# Patient Record
Sex: Female | Born: 1935 | Race: Black or African American | Hispanic: No | Marital: Single | Smoking: Never smoker
Health system: Southern US, Community
[De-identification: ages and names within clinical notes are randomized; demographics above are authoritative.]

## PROBLEM LIST (undated history)

## (undated) DIAGNOSIS — E785 Hyperlipidemia, unspecified: Secondary | ICD-10-CM

## (undated) DIAGNOSIS — I10 Essential (primary) hypertension: Secondary | ICD-10-CM

## (undated) DIAGNOSIS — M1990 Unspecified osteoarthritis, unspecified site: Secondary | ICD-10-CM

## (undated) HISTORY — PX: PACEMAKER INSERTION: SHX728

---

## 2011-05-17 ENCOUNTER — Emergency Department (HOSPITAL_COMMUNITY): Payer: Medicare PPO

## 2011-05-17 ENCOUNTER — Inpatient Hospital Stay (HOSPITAL_COMMUNITY)
Admission: EM | Admit: 2011-05-17 | Discharge: 2011-05-21 | DRG: 300 | Disposition: A | Discharge status: Home Health | Payer: Medicare PPO | Attending: Internal Medicine | Admitting: Internal Medicine

## 2011-05-17 DIAGNOSIS — R062 Wheezing: Secondary | ICD-10-CM | POA: Diagnosis present

## 2011-05-17 DIAGNOSIS — I82409 Acute embolism and thrombosis of unspecified deep veins of unspecified lower extremity: Principal | ICD-10-CM | POA: Diagnosis present

## 2011-05-17 DIAGNOSIS — N039 Chronic nephritic syndrome with unspecified morphologic changes: Secondary | ICD-10-CM | POA: Diagnosis present

## 2011-05-17 DIAGNOSIS — Z8673 Personal history of transient ischemic attack (TIA), and cerebral infarction without residual deficits: Secondary | ICD-10-CM

## 2011-05-17 DIAGNOSIS — I129 Hypertensive chronic kidney disease with stage 1 through stage 4 chronic kidney disease, or unspecified chronic kidney disease: Secondary | ICD-10-CM | POA: Diagnosis present

## 2011-05-17 DIAGNOSIS — F0390 Unspecified dementia without behavioral disturbance: Secondary | ICD-10-CM | POA: Diagnosis present

## 2011-05-17 DIAGNOSIS — E785 Hyperlipidemia, unspecified: Secondary | ICD-10-CM | POA: Diagnosis present

## 2011-05-17 DIAGNOSIS — D631 Anemia in chronic kidney disease: Secondary | ICD-10-CM | POA: Diagnosis present

## 2011-05-17 DIAGNOSIS — N184 Chronic kidney disease, stage 4 (severe): Secondary | ICD-10-CM | POA: Diagnosis present

## 2011-05-17 DIAGNOSIS — R0602 Shortness of breath: Secondary | ICD-10-CM | POA: Diagnosis present

## 2011-05-17 DIAGNOSIS — I441 Atrioventricular block, second degree: Secondary | ICD-10-CM | POA: Diagnosis present

## 2011-05-17 LAB — CBC
Hemoglobin: 11.8 g/dL — ABNORMAL LOW (ref 12.0–15.0)
MCH: 29.1 pg (ref 26.0–34.0)
MCV: 90.6 fL (ref 78.0–100.0)
RBC: 4.06 MIL/uL (ref 3.87–5.11)
WBC: 7 10*3/uL (ref 4.0–10.5)

## 2011-05-17 LAB — URINALYSIS, ROUTINE W REFLEX MICROSCOPIC
Glucose, UA: NEGATIVE mg/dL
Ketones, ur: NEGATIVE mg/dL
Leukocytes, UA: NEGATIVE
Specific Gravity, Urine: 1.017 (ref 1.005–1.030)
pH: 5.5 (ref 5.0–8.0)

## 2011-05-17 LAB — TROPONIN I: Troponin I: <0.3 ng/mL (ref <0.30)

## 2011-05-17 LAB — BASIC METABOLIC PANEL
CO2: 24 mEq/L (ref 19–32)
Chloride: 106 mEq/L (ref 96–112)
Creatinine, Ser: 1.34 mg/dL — ABNORMAL HIGH (ref 0.50–1.10)
Potassium: 3.9 mEq/L (ref 3.5–5.1)

## 2011-05-17 LAB — CARDIAC PANEL(CRET KIN+CKTOT+MB+TROPI)
Relative Index: 2 (ref 0.0–2.5)
Troponin I: <0.3 ng/mL (ref <0.30)

## 2011-05-17 LAB — CK TOTAL AND CKMB (NOT AT ARMC)
CK, MB: 5.4 ng/mL — ABNORMAL HIGH (ref 0.3–4.0)
Relative Index: 2.2 (ref 0.0–2.5)
Total CK: 247 U/L — ABNORMAL HIGH (ref 7–177)

## 2011-05-17 LAB — DIFFERENTIAL
Basophils Relative: 0 % (ref 0–1)
Lymphs Abs: 1.5 10*3/uL (ref 0.7–4.0)
Monocytes Relative: 5 % (ref 3–12)
Neutro Abs: 5.1 10*3/uL (ref 1.7–7.7)
Neutrophils Relative %: 73 % (ref 43–77)

## 2011-05-17 LAB — COMPREHENSIVE METABOLIC PANEL
ALT: 12 U/L (ref 0–35)
AST: 21 U/L (ref 0–37)
Albumin: 3.3 g/dL — ABNORMAL LOW (ref 3.5–5.2)
CO2: 22 mEq/L (ref 19–32)
Chloride: 107 mEq/L (ref 96–112)
GFR calc non Af Amer: 38 mL/min — ABNORMAL LOW (ref >60)
Potassium: 3.6 mEq/L (ref 3.5–5.1)
Sodium: 136 mEq/L (ref 135–145)
Total Bilirubin: 0.2 mg/dL — ABNORMAL LOW (ref 0.3–1.2)

## 2011-05-17 LAB — APTT: aPTT: 82 seconds — ABNORMAL HIGH (ref 24–37)

## 2011-05-17 LAB — PRO B NATRIURETIC PEPTIDE: Pro B Natriuretic peptide (BNP): 209.7 pg/mL (ref 0–450)

## 2011-05-17 MED ORDER — XENON XE 133 GAS
10.0000 | GAS_FOR_INHALATION | Freq: Once | RESPIRATORY_TRACT | Status: AC | PRN
  Administered 2011-05-17: 12 via RESPIRATORY_TRACT

## 2011-05-17 MED ORDER — TECHNETIUM TO 99M ALBUMIN AGGREGATED
5.0000 | Freq: Once | INTRAVENOUS | Status: AC | PRN
  Administered 2011-05-17: 5 via INTRAVENOUS

## 2011-05-18 ENCOUNTER — Inpatient Hospital Stay (HOSPITAL_COMMUNITY): Payer: Medicare PPO

## 2011-05-18 LAB — CBC
HCT: 31.3 % — ABNORMAL LOW (ref 36.0–46.0)
Hemoglobin: 10.4 g/dL — ABNORMAL LOW (ref 12.0–15.0)
MCHC: 33.2 g/dL (ref 30.0–36.0)
MCV: 90.2 fL (ref 78.0–100.0)
RDW: 16 % — ABNORMAL HIGH (ref 11.5–15.5)

## 2011-05-18 LAB — IRON AND TIBC
Iron: 43 ug/dL (ref 42–135)
UIBC: 238 ug/dL

## 2011-05-18 LAB — LIPID PANEL
LDL Cholesterol: 64 mg/dL (ref 0–99)
Triglycerides: 72 mg/dL (ref <150)
VLDL: 14 mg/dL (ref 0–40)

## 2011-05-18 LAB — TSH: TSH: 1.151 u[IU]/mL (ref 0.350–4.500)

## 2011-05-18 LAB — CARDIAC PANEL(CRET KIN+CKTOT+MB+TROPI)
CK, MB: 3.3 ng/mL (ref 0.3–4.0)
Relative Index: 1.8 (ref 0.0–2.5)
Total CK: 188 U/L — ABNORMAL HIGH (ref 7–177)

## 2011-05-18 LAB — FOLATE: Folate: >20 ng/mL

## 2011-05-18 LAB — BASIC METABOLIC PANEL
BUN: 28 mg/dL — ABNORMAL HIGH (ref 6–23)
GFR calc Af Amer: 43 mL/min — ABNORMAL LOW (ref >60)
GFR calc non Af Amer: 36 mL/min — ABNORMAL LOW (ref >60)
Potassium: 3.9 mEq/L (ref 3.5–5.1)

## 2011-05-18 LAB — VITAMIN B12: Vitamin B-12: 892 pg/mL (ref 211–911)

## 2011-05-18 LAB — HEPARIN LEVEL (UNFRACTIONATED): Heparin Unfractionated: 0.64 IU/mL (ref 0.30–0.70)

## 2011-05-19 LAB — CREATININE, URINE, RANDOM: Creatinine, Urine: 51.6 mg/dL

## 2011-05-19 LAB — BASIC METABOLIC PANEL
CO2: 22 mEq/L (ref 19–32)
Chloride: 109 mEq/L (ref 96–112)
Sodium: 140 mEq/L (ref 135–145)

## 2011-05-19 LAB — CBC
MCV: 91.4 fL (ref 78.0–100.0)
Platelets: 185 10*3/uL (ref 150–400)
RBC: 3.49 MIL/uL — ABNORMAL LOW (ref 3.87–5.11)
WBC: 7.7 10*3/uL (ref 4.0–10.5)

## 2011-05-19 LAB — PROTIME-INR
INR: 1.18 (ref 0.00–1.49)
Prothrombin Time: 15.3 seconds — ABNORMAL HIGH (ref 11.6–15.2)

## 2011-05-19 LAB — HEPARIN LEVEL (UNFRACTIONATED): Heparin Unfractionated: 0.48 IU/mL (ref 0.30–0.70)

## 2011-05-20 LAB — HEPARIN LEVEL (UNFRACTIONATED)
Heparin Unfractionated: 1.02 IU/mL — ABNORMAL HIGH (ref 0.30–0.70)
Heparin Unfractionated: 1.01 IU/mL — ABNORMAL HIGH (ref 0.30–0.70)
Heparin Unfractionated: 0.59 IU/mL (ref 0.30–0.70)

## 2011-05-20 LAB — PROTIME-INR: INR: 1.09 (ref 0.00–1.49)

## 2011-05-20 LAB — URINALYSIS, ROUTINE W REFLEX MICROSCOPIC
Bilirubin Urine: NEGATIVE
Ketones, ur: NEGATIVE mg/dL
Nitrite: NEGATIVE
Urobilinogen, UA: 0.2 mg/dL (ref 0.0–1.0)
pH: 5.5 (ref 5.0–8.0)

## 2011-05-20 LAB — BASIC METABOLIC PANEL
Calcium: 9.1 mg/dL (ref 8.4–10.5)
GFR calc non Af Amer: 34 mL/min — ABNORMAL LOW (ref >60)
Sodium: 138 mEq/L (ref 135–145)

## 2011-05-20 LAB — CBC
Platelets: 199 10*3/uL (ref 150–400)
RBC: 3.91 MIL/uL (ref 3.87–5.11)
WBC: 7.5 10*3/uL (ref 4.0–10.5)

## 2011-05-20 LAB — URINE CULTURE: Culture  Setup Time: 201207022216

## 2011-05-21 LAB — BASIC METABOLIC PANEL
CO2: 25 mEq/L (ref 19–32)
Calcium: 8.8 mg/dL (ref 8.4–10.5)
Chloride: 109 mEq/L (ref 96–112)
Glucose, Bld: 95 mg/dL (ref 70–99)
Sodium: 141 mEq/L (ref 135–145)

## 2011-05-25 NOTE — H&P (Signed)
NAME:  Allison Alvarado, Allison Alvarado NO.:  [id number]  MEDICAL RECORD NO.:  [id number]  LOCATION:  WLED                         FACILITY:  [facility]  PHYSICIAN:  Kathlen Mody, MD       DATE OF BIRTH:  Mar 30, 1915  DATE OF ADMISSION:  05/17/2011 DATE OF DISCHARGE:                             HISTORY & PHYSICAL   PRIMARY CARE PHYSICIAN:  Dr. Alan Mulder at [place] at [facility]  CHIEF COMPLAINT:  Came into the ER for worsening shortness of breath since yesterday.  HISTORY OF PRESENT ILLNESS:  This is a 77 year old lady with history of hypertension, chronic kidney disease, and hyperlipidemia, came to visit her daughter from [place].  She had a flight on June 14th from [place] to [place].  After she came and she has worsening lower extremity swelling more on the left side when compared to the right side.  Also, complaining of shortness of breath associated with little bit of wheezing.  The patient denies any chest pain, syncope, orthopnea, or PND.  She denies any pain in her extremities.  The patient denies any nausea, vomiting, abdominal pain, or diarrhea.  The patient denies any urinary complaints.  The patient denies any headache or blurry vision. The patient also denies tingling or numbness anywhere in the body or generalized weakness.  REVIEW OF SYSTEMS:  See HPI, otherwise negative.  PAST MEDICAL HISTORY: 1. History of hypertension. 2. Hyperlipidemia. 3. Osteoarthritis. 4. Remote history of CVA. 5. Stage 3 chronic kidney disease.  PAST SURGICAL HISTORY:  History of vein stripping for varicose veins.  FAMILY HISTORY:  History of coronary artery disease in her mother.  ALLERGIES:  No known drug allergies.  HOME MEDICATIONS: 1. Pravastatin 80 mg daily. 2. Celebrex 200 mg daily. 3. Nifedical XL 60 mg daily. 4. Aspirin 81 mg daily. 5. Omeprazole 40 mg daily. 6. Multivitamins 1 tablet daily.  SOCIAL HISTORY:  The patient lives in [place] with her  daughter.  Does not smoke, EtOH, or any recreational drug use.  ER COURSE:  On arrival to the ER, the patient had venous Dopplers of the lower extremities, even though her left lower extremity was more swollen than the right, they found chronic DVT in the right common femoral vein.  EKG, the patient had T-wave inversions in leads II, III, aVF, and from V4 to V6.  We do not have old EKG to compare to.  PERTINENT LABORATORY DATA:  The patient had a CBC done, significant for hemoglobin of 11.8, hematocrit of 36.8, and platelets of 224. Urinalysis, negative for nitrites and leukocytes.  Basic metabolic panel, significant for a creatinine of 1.34 and a BUN of 24.  Troponin was negative.  Total CK was 247 and CK-MB of 5.4.  ProBNP is 209.  RADIOLOGY:  The patient had a chest x-ray, which shows cardiomegaly without acute disease.  ASSESSMENT AND PLAN:  This is a 77 year old lady, history of hypertension, chronic kidney disease, and hyperlipidemia, admitted for worsening lower extremity edema, and worsening shortness of breath. 1. Right lower extremity deep venous thrombosis.  Start heparin, GTT.     As per pharmacy, could not start her on Lovenox secondary to her  chronic kidney disease. 2. Worsening shortness of breath.  We will rule out pulmonary embolism     since there is a history of recent flight journey and also has a     history of deep venous thrombosis.  We will get a V/Q scan and     oxygen as needed. 3. Also we will admit her to Telemetry to rule out acute coronary     syndrome.  We will get cardiac enzymes x3 q.6 h.  We will get a 2-D     echocardiogram, repeat the EKG in the morning.  We will call     Cardiology as needed. 4. Her hypertension is controlled at this time.  Continue with her     blood pressure medications. 5. Stage 3 chronic kidney disease.  We will start her on gentle     hydration, get an ultrasound kidney. 6. Hyperlipidemia.  Get a fasting lipid  profile.  Continue with     pravastatin 80 mg daily. 7. Normocytic anemia.  We will get an anemia profile. 8. For gastrointestinal prophylaxis, continue omeprazole 40 mg daily.     For deep venous thrombosis prophylaxis, the patient will be on     heparin, GTT. 9. The patient is full code.          ______________________________ Kathlen Mody, MD     VA/MEDQ  D:  05/17/2011  T:  05/17/2011  Job:  [id number]  Electronically Signed by Kathlen Mody MD on 05/25/2011 02:34:08 AM

## 2011-05-27 NOTE — Discharge Summary (Addendum)
Allison Alvarado, Allison Alvarado             ACCOUNT NO.:  [id number]  MEDICAL RECORD NO.:  [id number]  LOCATION:  1410                         FACILITY:  [facility]  PHYSICIAN:  Ladell Pier, M.D.   DATE OF BIRTH:  20-Jan-1915  DATE OF ADMISSION:  05/17/2011 DATE OF DISCHARGE:  05/21/2011                              DISCHARGE SUMMARY   PRIMARY CARE PROVIDER:  None locally.  Patient is from [place] in [place] visiting family.  She will be followed up with by Dr. Della Goo who is the primary care provider of her daughter with whom she is staying.  DISCHARGE DIAGNOSES: 1. Right lower extremity deep vein thrombosis. 2. Worsening shortness of breath, now resolved. 3. Telemetry with sinus rhythm and secondary heart block per EKG. 4. Hypertension. 5. Chronic kidney disease stage 3 to 4. 6. Remote cerebrovascular accident. 7. Anemia of chronic disease. 8. Dementia.  DISCHARGE MEDICATIONS: 1. Lovenox 80 mg subcutaneously every 24 hours for 10 days. 2. Warfarin 5 mg p.o. daily. 3. Acetaminophen 650 mg p.o. daily. 4. Aspirin 81 mg p.o. daily. 5. Celebrex 200 mg p.o. daily. 6. Icy Hot patch over-the-counter one patch transdermally every other     day as needed to shoulder pain. 7. Multivitamin 1 tablet p.o. daily. 8. Nifedipine XR 60 mg p.o. daily. 9. Omeprazole 40 mg p.o. daily. 10.Pravastatin 80 mg p.o. daily. 11.Tylenol 325 mg p.o. every 6 hours as needed for pain.  DIAGNOSTIC LABS:  WBCs 7.0, hemoglobin 11.8, hematocrit 36.8, platelets 224.  Sodium 139, potassium 3.9, chloride 106, CO2 of 24, BUN 24, creatinine 1.34, glucose 86.  Urinalysis was negative for set of cardiac enzymes, total CK 247, CK-MB 5.4, troponin 1 less than 0.30.  Pro-BNP was 209.7.  PT 14.6, INR 1.12, PTT 82.  Second set of cardiac enzymes, total CK 208, CK-MB 4.1, troponin 1 less than 0.30.  Magnesium 2.1, phosphorus 3.1, TSH 1.15.  Anemia panel yields iron of 43, TIBC 281, percent saturation 15, vitamin  B12 892, ferritin level 321.  Lipid profile yields cholesterol 123, triglycerides 72, HDL 45, LDL 64, VLDL 14.  Urine creatinine was 51.6.  Urine sodium was 89.  Urine culture on July 2 showed greater than 100,000 colonies and E coli; however, her initial urinalysis was negative, so I doubted the accuracy of this, repeated her urinalysis on July 5 and it was also negative.  DIAGNOSTIC IMAGING: 1. On admission, chest x-ray showed cardiomegaly without acute     disease. 2. On admission x-ray of left knee showed no fracture or dislocation.     Tricompartmental degenerative changes of the knee.     Chondrocalcinosis. 3. VQ scan on admission shows low probability for pulmonary embolus. 4. Renal ultrasound on July 3 shows small bilateral kidneys with     increased echogenicity in keeping with chronic medical renal     disease.  No hydronephrosis. 5. 2-D echo on July 3 yields moderate concentric hypertrophy.     Ejection fraction of 60%-65%.  Doppler parameters consistent with     abnormal left ventricular relaxation, grade 1 diastolic     dysfunction.  CONSULTATIONS:  None.  PROCEDURES:  None.  BRIEF HISTORY:  Allison Alvarado is a 77 year old  female with a history of hypertension, chronic kidney disease, hyperlipidemia, who came to visit her daughter from [place].  She had a flight on June 14 from [place] to [place].  After she arrived, she developed worsening lower extremity swelling, more on the left when compared to the right.  Also, developed shortness of breath associated with a little bit of wheezing.  The patient denied any chest pain, palpitations, syncope, or orthopnea.  She denied any pain in her extremities.  There was no report of nausea, vomiting, abdominal pain, or diarrhea..  [facility] were asked to admit for further evaluation and treatment.  HOSPITAL COURSE BY PROBLEM: 1. Right lower extremity DVT.  The patient was started on a heparin     drip per pharmacy.   The pharmacy also managed her Coumadin.  On     July 5, heparin was discontinued and Lovenox started.  I spoke with     her nephrologist office in [place] to obtain baseline information     regarding her chronic kidney disease and her Lovenox has been dosed     accordingly.  The patient will be discharged on 80 mg of Lovenox.     She will receive 7.5 mg of Coumadin today.  She will be provided     with 5 mg of Coumadin to be taken daily until July 9 at which time     she will go to her primary care provider and have her INR checked     and the dosage will be adjusted based on that data.  The daughter     was provided with instructions regarding Lovenox administration. 2. Shortness of breath.  The patient did have some wheezing on     admission.  VQ scan as stated above pulmonary embolism was ruled     out.  She was provided with oxygen as needed.  Her shortness of     breath resolved.  She worked with PT and did have some mild     shortness of breath on exertion, but at the time of this discharge,     she is maintaining saturation levels of 94-98 on room air. 3. Telemetry with sinus rhythm and second-degree heart block per EKG.     The patient experienced no chest pain or palpitation.  She     experienced no cardiac symptoms at presentation or during her     hospitalization.  She does have benign Mobitz type 1 per telemetry.     She was monitored throughout her hospitalization and remained     asymptomatic. 4. Hypertension, blood pressure was controlled.  She was continued on     her home medicines and will continue those at discharge. 5. Chronic kidney disease stage 3 or 4 as stated above.  I spoke with     her nephrologist office in [place].  They did fax notes from last     office visit 2 months ago as well as labs.  It appears that her     baseline creatinine is 1.6-1.9.  At the time of this discharge, her     creatinine is close to baseline.  Specifically, her creatinine is      1.42. 6. Remote CVA, continue aspirin. 7. Anemia of chronic disease has been stable during her     hospitalization.  Her home meds were continued. 8. Dementia.  The patient remained at her baseline level.  PHYSICAL EXAMINATION:  VITAL SIGNS:  Temperature 97.6, blood pressure 130/63, heart rate  71, respiration 18, sats 94%. GENERAL: Awake, alert, sitting up in chair, in no acute distress. CV:  Regular rate and rhythm.  No murmur, gallop, or rub.  Trace lower extremity edema with left being a little greater than right.  Pedal pulses present and palpable. RESPIRATORY:  Normal effort.  Breath sounds clear to auscultation bilaterally.  No wheeze, rhonchi, or rales. ABDOMEN:  Obese, soft, positive bowel sounds throughout, nontender to palpation. NEURO:  Alert and oriented to self and place only.  Follows commands. Cranial nerves II through XII grossly intact, able to make her needs known.  Does have short-term memory issues.  FOLLOWUP:  The patient will see Dr. Della Goo who is the primary care provider of her daughter.  Daughter will make that appointment. [facility] will come to the patient's home for PT, OT and INR draws.  First one to be scheduled July 9 by [facility].  INR is to be evaluated by Della Goo and Coumadin dose adjusted.  Will also need information regarding duration of Lovenox administration.  ACTIVITY:  No restrictions.  DIET:  No restrictions.  DISPOSITION:  The patient is medically stable and ready to be discharged to her daughter's home.  Time spent on this discharge 45 minutes.     Gwenyth Bender, NP   ______________________________ Ladell Pier, M.D.    KMB/MEDQ  D:  05/21/2011  T:  05/21/2011  Job:  [id number]  Electronically Signed by Toya Smothers  on 05/27/2011 07:49:22 AM Electronically Signed by Ladell Pier M.D. on 06/03/2011 11:00:50 PM

## 2011-06-04 ENCOUNTER — Emergency Department (HOSPITAL_COMMUNITY)
Admission: EM | Admit: 2011-06-04 | Discharge: 2011-06-04 | Disposition: A | Discharge status: Home / Self Care | Payer: Medicare PPO | Attending: Emergency Medicine | Admitting: Emergency Medicine

## 2011-06-04 DIAGNOSIS — Z7901 Long term (current) use of anticoagulants: Secondary | ICD-10-CM | POA: Insufficient documentation

## 2011-06-04 DIAGNOSIS — X58XXXA Exposure to other specified factors, initial encounter: Secondary | ICD-10-CM | POA: Insufficient documentation

## 2011-06-04 DIAGNOSIS — Z86718 Personal history of other venous thrombosis and embolism: Secondary | ICD-10-CM | POA: Insufficient documentation

## 2011-06-04 DIAGNOSIS — S9000XA Contusion of unspecified ankle, initial encounter: Secondary | ICD-10-CM | POA: Insufficient documentation

## 2011-06-04 DIAGNOSIS — Z8673 Personal history of transient ischemic attack (TIA), and cerebral infarction without residual deficits: Secondary | ICD-10-CM | POA: Insufficient documentation

## 2011-06-04 LAB — POCT I-STAT, CHEM 8
Calcium, Ion: 1.16 mmol/L (ref 1.12–1.32)
Chloride: 109 mEq/L (ref 96–112)
Creatinine, Ser: 1.4 mg/dL — ABNORMAL HIGH (ref 0.50–1.10)
Glucose, Bld: 88 mg/dL (ref 70–99)
Hemoglobin: 13.6 g/dL (ref 12.0–15.0)
Potassium: 4.3 mEq/L (ref 3.5–5.1)

## 2011-06-04 LAB — CBC
HCT: 38.5 % (ref 36.0–46.0)
Hemoglobin: 12.4 g/dL (ref 12.0–15.0)
MCH: 29.2 pg (ref 26.0–34.0)
MCHC: 32.2 g/dL (ref 30.0–36.0)
RDW: 16 % — ABNORMAL HIGH (ref 11.5–15.5)

## 2011-06-04 LAB — BASIC METABOLIC PANEL
BUN: 21 mg/dL (ref 6–23)
CO2: 25 mEq/L (ref 19–32)
Chloride: 103 mEq/L (ref 96–112)
Creatinine, Ser: 1.27 mg/dL — ABNORMAL HIGH (ref 0.50–1.10)
Glucose, Bld: 83 mg/dL (ref 70–99)

## 2013-05-30 ENCOUNTER — Ambulatory Visit (HOSPITAL_COMMUNITY)
Admission: RE | Admit: 2013-05-30 | Discharge: 2013-05-30 | Disposition: A | Discharge status: Home / Self Care | Payer: Medicare PPO | Source: Ambulatory Visit | Attending: Internal Medicine | Admitting: Internal Medicine

## 2013-05-30 ENCOUNTER — Other Ambulatory Visit (HOSPITAL_COMMUNITY): Payer: Self-pay | Admitting: Internal Medicine

## 2013-05-30 DIAGNOSIS — R52 Pain, unspecified: Secondary | ICD-10-CM

## 2013-05-30 DIAGNOSIS — W19XXXA Unspecified fall, initial encounter: Secondary | ICD-10-CM

## 2013-09-05 ENCOUNTER — Emergency Department (HOSPITAL_COMMUNITY): Payer: Medicare PPO

## 2013-09-05 ENCOUNTER — Encounter (HOSPITAL_COMMUNITY): Payer: Self-pay | Admitting: Emergency Medicine

## 2013-09-05 ENCOUNTER — Emergency Department (HOSPITAL_COMMUNITY)
Admission: EM | Admit: 2013-09-05 | Discharge: 2013-09-05 | Disposition: A | Discharge status: Home / Self Care | Payer: Medicare PPO | Attending: Emergency Medicine | Admitting: Emergency Medicine

## 2013-09-05 DIAGNOSIS — R110 Nausea: Secondary | ICD-10-CM | POA: Insufficient documentation

## 2013-09-05 DIAGNOSIS — R630 Anorexia: Secondary | ICD-10-CM | POA: Insufficient documentation

## 2013-09-05 DIAGNOSIS — R1031 Right lower quadrant pain: Secondary | ICD-10-CM | POA: Insufficient documentation

## 2013-09-05 DIAGNOSIS — R1033 Periumbilical pain: Secondary | ICD-10-CM | POA: Insufficient documentation

## 2013-09-05 DIAGNOSIS — I10 Essential (primary) hypertension: Secondary | ICD-10-CM | POA: Insufficient documentation

## 2013-09-05 DIAGNOSIS — Z8739 Personal history of other diseases of the musculoskeletal system and connective tissue: Secondary | ICD-10-CM | POA: Insufficient documentation

## 2013-09-05 DIAGNOSIS — N289 Disorder of kidney and ureter, unspecified: Secondary | ICD-10-CM

## 2013-09-05 HISTORY — DX: Unspecified osteoarthritis, unspecified site: M19.90

## 2013-09-05 HISTORY — DX: Essential (primary) hypertension: I10

## 2013-09-05 LAB — COMPREHENSIVE METABOLIC PANEL
ALT: 13 U/L (ref 0–35)
Albumin: 3.7 g/dL (ref 3.5–5.2)
Alkaline Phosphatase: 91 U/L (ref 39–117)
BUN: 24 mg/dL — ABNORMAL HIGH (ref 6–23)
Chloride: 103 mEq/L (ref 96–112)
GFR calc Af Amer: 43 mL/min — ABNORMAL LOW (ref >90)
Glucose, Bld: 89 mg/dL (ref 70–99)
Potassium: 3.8 mEq/L (ref 3.5–5.1)
Sodium: 135 mEq/L (ref 135–145)
Total Bilirubin: 0.3 mg/dL (ref 0.3–1.2)
Total Protein: 7.7 g/dL (ref 6.0–8.3)

## 2013-09-05 LAB — URINALYSIS, ROUTINE W REFLEX MICROSCOPIC
Bilirubin Urine: NEGATIVE
Glucose, UA: NEGATIVE mg/dL
Ketones, ur: NEGATIVE mg/dL
Leukocytes, UA: NEGATIVE
Protein, ur: NEGATIVE mg/dL
pH: 6 (ref 5.0–8.0)

## 2013-09-05 LAB — CBC WITH DIFFERENTIAL/PLATELET
Eosinophils Absolute: 0.1 10*3/uL (ref 0.0–0.7)
Eosinophils Relative: 1 % (ref 0–5)
HCT: 38.9 % (ref 36.0–46.0)
Lymphocytes Relative: 21 % (ref 12–46)
MCH: 29.2 pg (ref 26.0–34.0)
MCHC: 32.4 g/dL (ref 30.0–36.0)
MCV: 90.3 fL (ref 78.0–100.0)
Monocytes Absolute: 0.3 10*3/uL (ref 0.1–1.0)
RDW: 15.2 % (ref 11.5–15.5)

## 2013-09-05 MED ORDER — IOHEXOL 300 MG/ML  SOLN
50.0000 mL | Freq: Once | INTRAMUSCULAR | Status: AC | PRN
  Administered 2013-09-05: 50 mL via ORAL

## 2013-09-05 MED ORDER — MORPHINE SULFATE 2 MG/ML IJ SOLN
2.0000 mg | Freq: Once | INTRAMUSCULAR | Status: AC
  Administered 2013-09-05: 2 mg via INTRAVENOUS
  Filled 2013-09-05: qty 1

## 2013-09-05 MED ORDER — SODIUM CHLORIDE 0.9 % IV BOLUS (SEPSIS)
500.0000 mL | Freq: Once | INTRAVENOUS | Status: AC
  Administered 2013-09-05: 500 mL via INTRAVENOUS

## 2013-09-05 MED ORDER — IOHEXOL 300 MG/ML  SOLN
100.0000 mL | Freq: Once | INTRAMUSCULAR | Status: AC | PRN
  Administered 2013-09-05: 100 mL via INTRAVENOUS

## 2013-09-05 NOTE — ED Notes (Signed)
Bed: ZO10 Expected date:  Expected time:  Means of arrival:  Comments: 79 y/o M fall, thinners

## 2013-09-05 NOTE — ED Notes (Signed)
Per EMS: pt c/o of lower right quadrant pain woke up with this pain yesterday morning but went away, then woke up this morning with it and it has not went away. Pain radiates to upper right leg. Daughter states urine is stronger than normal.

## 2013-09-05 NOTE — Progress Notes (Signed)
EDCM spoke to patient and patient's family member at bedside.  As per patient's family member, patient is from [place].  Her pcp in [place] is Dr. Sonny Masters.  The physician she has seen here in [place] is Dr. Maryella Shivers.  As per patient's family member, patient is going back to [place].

## 2013-09-05 NOTE — ED Provider Notes (Signed)
CSN: [id number]     Arrival date & time 09/05/13  1109 History   First MD Initiated Contact with Patient 09/05/13 1113     Chief Complaint  Patient presents with  . Abdominal Pain   (Consider location/radiation/quality/duration/timing/severity/associated sxs/prior Treatment) HPI Comments: 79 yo from the [place] area presents with lower central and RLQ abd pain since yesterday, intermittent, no hx of similar.  No abd surgery hx or gb issues.  No fevers.  Mild nausea.  Mild radiation to groin.  No urinary sxs but hx of UTI.  Ache.    Patient is a 79 y.o. female presenting with abdominal pain. The history is provided by the patient.  Abdominal Pain Associated symptoms: no chest pain, no chills, no dysuria, no fever, no shortness of breath and no vomiting     Past Medical History  Diagnosis Date  . Arthritis   . Hypertension    History reviewed. No pertinent past surgical history. No family history on file. History  Substance Use Topics  . Smoking status: Not on file  . Smokeless tobacco: Not on file  . Alcohol Use: No   OB History   Grav Para Term Preterm Abortions TAB SAB Ect Mult Living                 Review of Systems  Constitutional: Positive for appetite change. Negative for fever and chills.  Eyes: Negative for visual disturbance.  Respiratory: Negative for shortness of breath.   Cardiovascular: Negative for chest pain.  Gastrointestinal: Positive for abdominal pain. Negative for vomiting.  Genitourinary: Negative for dysuria and flank pain.  Musculoskeletal: Negative for back pain, neck pain and neck stiffness.  Skin: Negative for rash.  Neurological: Negative for light-headedness and headaches.    Allergies  Review of patient's allergies indicates no known allergies.  Home Medications  No current outpatient prescriptions on file. BP 138/100  Pulse 84  Temp(Src) 98.4 F (36.9 C) (Oral)  Resp 20  SpO2 98% Physical Exam  Nursing note and vitals  reviewed. Constitutional: She is oriented to person, place, and time. She appears well-developed and well-nourished.  HENT:  Head: Normocephalic and atraumatic.  Eyes: Conjunctivae are normal. Right eye exhibits no discharge. Left eye exhibits no discharge.  Neck: Normal range of motion. Neck supple. No tracheal deviation present.  Cardiovascular: Normal rate and regular rhythm.   Pulmonary/Chest: Effort normal and breath sounds normal.  Abdominal: Soft. She exhibits no distension. There is tenderness (umbilical and RLQ). There is no guarding.  Musculoskeletal: She exhibits no edema.  Neurological: She is alert and oriented to person, place, and time.  Skin: Skin is warm. No rash noted.  Psychiatric: She has a normal mood and affect.    ED Course  Procedures (including critical care time) EMERGENCY DEPARTMENT ULTRASOUND  Study: Limited Retroperitoneal Ultrasound of the Abdominal Aorta.  INDICATIONS:Abdominal pain and Age>55 Multiple views of the abdominal aorta were obtained in real-time from the diaphragmatic hiatus to the aortic bifurcation in transverse planes with a multi-frequency probe. PERFORMED BY: Myself IMAGES ARCHIVED?: Yes FINDINGS: Abdominal aorta viewed in short and long axis, no enlargement noted LIMITATIONS:  Bowel gas INTERPRETATION:  No abdominal aortic aneurysm   Labs Review Labs Reviewed  COMPREHENSIVE METABOLIC PANEL - Abnormal; Notable for the following:    BUN 24 (*)    Creatinine, Ser 1.19 (*)    GFR calc non Af Amer 37 (*)    GFR calc Af Amer 43 (*)    All other components  within normal limits  URINE CULTURE  CBC WITH DIFFERENTIAL  URINALYSIS, ROUTINE W REFLEX MICROSCOPIC   Imaging Review Ct Abdomen Pelvis W Contrast  09/05/2013   CLINICAL DATA:  Right lower quadrant pain.  EXAM: CT ABDOMEN AND PELVIS WITH CONTRAST  TECHNIQUE: Multidetector CT imaging of the abdomen and pelvis was performed using the standard protocol following bolus administration  of intravenous contrast.  CONTRAST:  OMNIPAQUE IOHEXOL 300 MG/ML  SOLN  COMPARISON:  None.  FINDINGS: Right pacer wires are noted in the right heart. Dense coronary artery calcifications. Mild cardiomegaly. No confluent airspace opacities in the lung bases. No effusions.  Scattered hypodensities in the liver, likely small cysts. Spleen, pancreas, adrenals are unremarkable. There are hypodensities within the kidneys, likely small cysts. Within the upper pole of the left kidney, there is a 13 mm area demonstrating central enhancement. Cannot exclude a small solid lesion such as renal cell carcinoma.  Uterus, left ovary and urinary bladder unremarkable. Small calcifications scattered throughout the right ovary without visible mass. Appendix is visualized and is normal. Stomach, large and small bowel are grossly unremarkable except for scattered sigmoid diverticula. No active diverticulitis. No free fluid, free air or adenopathy. Aorta and iliac vessels are heavily calcified, non aneurysmal.  No acute bony abnormality. Advanced degenerative changes in the lumbar spine.  IMPRESSION: 13 mm enhancing lesion within the upper pole of the left kidney. Cannot exclude small renal cell carcinoma.  Scattered cysts within the kidneys and liver.  Sigmoid diverticulosis. No active diverticulitis.   Electronically Signed   By: Charlett Nose M.D.   On: 09/05/2013 14:45    EKG Interpretation   None       MDM  No diagnosis found.  Bedside US done due to new pain/ age - no AAA on bedside US. Diff appy vs stone vs infection. CT abd pelvis ordered.  Pain meds given and fluids, npo until results. Pt improved in ED, pain controlled.  CT reviewed, renal lesion, possibly cancer however with age/ minimal sxs discussed close fup with pcp for further eval outpt.  Difficulty obtaining UA, cath ordered. UA pending Results and differential diagnosis were discussed with the patient. Close follow up outpatient was discussed,  patient comfortable with the plan.   Diagnosis: CRF, Abd pain lower     Enid Skeens, MD 09/05/13 [phone]

## 2013-09-05 NOTE — ED Notes (Signed)
Patient transported to CT 

## 2013-09-06 LAB — URINE CULTURE
Colony Count: NO GROWTH
Culture: NO GROWTH

## 2014-08-23 ENCOUNTER — Emergency Department (HOSPITAL_COMMUNITY)
Admission: EM | Admit: 2014-08-23 | Discharge: 2014-08-23 | Disposition: A | Discharge status: Home / Self Care | Payer: Medicare PPO | Attending: Emergency Medicine | Admitting: Emergency Medicine

## 2014-08-23 ENCOUNTER — Encounter (HOSPITAL_COMMUNITY): Payer: Self-pay | Admitting: Emergency Medicine

## 2014-08-23 ENCOUNTER — Emergency Department (HOSPITAL_COMMUNITY): Payer: Medicare PPO

## 2014-08-23 DIAGNOSIS — Z79899 Other long term (current) drug therapy: Secondary | ICD-10-CM | POA: Insufficient documentation

## 2014-08-23 DIAGNOSIS — Z792 Long term (current) use of antibiotics: Secondary | ICD-10-CM | POA: Insufficient documentation

## 2014-08-23 DIAGNOSIS — E785 Hyperlipidemia, unspecified: Secondary | ICD-10-CM | POA: Insufficient documentation

## 2014-08-23 DIAGNOSIS — Z87440 Personal history of urinary (tract) infections: Secondary | ICD-10-CM | POA: Insufficient documentation

## 2014-08-23 DIAGNOSIS — R52 Pain, unspecified: Secondary | ICD-10-CM

## 2014-08-23 DIAGNOSIS — Z791 Long term (current) use of non-steroidal anti-inflammatories (NSAID): Secondary | ICD-10-CM | POA: Insufficient documentation

## 2014-08-23 DIAGNOSIS — R1031 Right lower quadrant pain: Secondary | ICD-10-CM | POA: Diagnosis not present

## 2014-08-23 DIAGNOSIS — R630 Anorexia: Secondary | ICD-10-CM | POA: Insufficient documentation

## 2014-08-23 DIAGNOSIS — M1990 Unspecified osteoarthritis, unspecified site: Secondary | ICD-10-CM | POA: Insufficient documentation

## 2014-08-23 DIAGNOSIS — Z7982 Long term (current) use of aspirin: Secondary | ICD-10-CM | POA: Diagnosis not present

## 2014-08-23 DIAGNOSIS — Z950 Presence of cardiac pacemaker: Secondary | ICD-10-CM | POA: Insufficient documentation

## 2014-08-23 DIAGNOSIS — I10 Essential (primary) hypertension: Secondary | ICD-10-CM | POA: Diagnosis not present

## 2014-08-23 HISTORY — DX: Hyperlipidemia, unspecified: E78.5

## 2014-08-23 LAB — COMPREHENSIVE METABOLIC PANEL
ALT: 13 U/L (ref 0–35)
AST: 28 U/L (ref 0–37)
Albumin: 3.7 g/dL (ref 3.5–5.2)
Alkaline Phosphatase: 81 U/L (ref 39–117)
Anion gap: 15 (ref 5–15)
BILIRUBIN TOTAL: 0.3 mg/dL (ref 0.3–1.2)
BUN: 29 mg/dL — ABNORMAL HIGH (ref 6–23)
CHLORIDE: 103 meq/L (ref 96–112)
CO2: 21 meq/L (ref 19–32)
Calcium: 9.3 mg/dL (ref 8.4–10.5)
Creatinine, Ser: 1.81 mg/dL — ABNORMAL HIGH (ref 0.50–1.10)
GFR calc Af Amer: 26 mL/min — ABNORMAL LOW (ref >90)
GFR, EST NON AFRICAN AMERICAN: 22 mL/min — AB (ref >90)
Glucose, Bld: 87 mg/dL (ref 70–99)
Potassium: 4.7 mEq/L (ref 3.7–5.3)
Sodium: 139 mEq/L (ref 137–147)
Total Protein: 7.5 g/dL (ref 6.0–8.3)

## 2014-08-23 LAB — CBC WITH DIFFERENTIAL/PLATELET
BASOS ABS: 0 10*3/uL (ref 0.0–0.1)
Basophils Relative: 0 % (ref 0–1)
Eosinophils Absolute: 0.1 10*3/uL (ref 0.0–0.7)
Eosinophils Relative: 1 % (ref 0–5)
HCT: 36.7 % (ref 36.0–46.0)
Hemoglobin: 12.1 g/dL (ref 12.0–15.0)
LYMPHS ABS: 1.3 10*3/uL (ref 0.7–4.0)
LYMPHS PCT: 23 % (ref 12–46)
MCH: 29.9 pg (ref 26.0–34.0)
MCHC: 33 g/dL (ref 30.0–36.0)
MCV: 90.6 fL (ref 78.0–100.0)
MONO ABS: 0.3 10*3/uL (ref 0.1–1.0)
Monocytes Relative: 6 % (ref 3–12)
NEUTROS ABS: 4 10*3/uL (ref 1.7–7.7)
Neutrophils Relative %: 70 % (ref 43–77)
Platelets: 149 10*3/uL — ABNORMAL LOW (ref 150–400)
RBC: 4.05 MIL/uL (ref 3.87–5.11)
RDW: 15.5 % (ref 11.5–15.5)
WBC: 5.6 10*3/uL (ref 4.0–10.5)

## 2014-08-23 LAB — LIPASE, BLOOD: LIPASE: 32 U/L (ref 11–59)

## 2014-08-23 LAB — URINALYSIS, ROUTINE W REFLEX MICROSCOPIC
BILIRUBIN URINE: NEGATIVE
GLUCOSE, UA: NEGATIVE mg/dL
Hgb urine dipstick: NEGATIVE
KETONES UR: NEGATIVE mg/dL
Leukocytes, UA: NEGATIVE
NITRITE: NEGATIVE
PH: 5 (ref 5.0–8.0)
Protein, ur: NEGATIVE mg/dL
Specific Gravity, Urine: 1.013 (ref 1.005–1.030)
Urobilinogen, UA: 0.2 mg/dL (ref 0.0–1.0)

## 2014-08-23 MED ORDER — IOHEXOL 300 MG/ML  SOLN: 50.0000 mL | Freq: Once | INTRAMUSCULAR | Status: DC | PRN

## 2014-08-23 MED ORDER — ONDANSETRON HCL 4 MG/2ML IJ SOLN
4.0000 mg | Freq: Once | INTRAMUSCULAR | Status: AC
  Administered 2014-08-23: 4 mg via INTRAVENOUS
  Filled 2014-08-23: qty 2

## 2014-08-23 MED ORDER — SODIUM CHLORIDE 0.9 % IV SOLN
INTRAVENOUS | Status: DC
  Administered 2014-08-23: 15:00:00 via INTRAVENOUS

## 2014-08-23 MED ORDER — TRAMADOL HCL 50 MG PO TABS: 50.0000 mg | ORAL_TABLET | Freq: Four times a day (QID) | ORAL | Status: AC | PRN

## 2014-08-23 MED ORDER — MORPHINE SULFATE 2 MG/ML IJ SOLN
2.0000 mg | Freq: Once | INTRAMUSCULAR | Status: AC
  Administered 2014-08-23: 2 mg via INTRAVENOUS
  Filled 2014-08-23: qty 1

## 2014-08-23 NOTE — Discharge Instructions (Signed)

## 2014-08-23 NOTE — ED Provider Notes (Signed)
CSN: [id number]     Arrival date & time 08/23/14  1220 History   First MD Initiated Contact with Patient 08/23/14 1249     Chief Complaint  Patient presents with  . Abdominal Pain     (Consider location/radiation/quality/duration/timing/severity/associated sxs/prior Treatment) Patient is a 80 y.o. female presenting with abdominal pain. The history is provided by a relative.  Abdominal Pain   Allison Alvarado is a 80 y.o. female who is here for evaluation of right lower quadrant abdominal pain, which started one week ago,  and his been present intermittently. She was seen 2 days ago and put on an antibiotic, and Colace. Apparently, this was treatment, for UTI. She has had decreased appetite for the last several days, and is indicating to her daughter, that she has pain, by holding her stomach. There's been no documented fever. No cough, no reported chest pain, and no change in her ability to ambulate. There are no other known modifying factors.  Level V Caveat- altered mental status   Past Medical History  Diagnosis Date  . Arthritis   . Hypertension   . Hyperlipidemia    Past Surgical History  Procedure Laterality Date  . Pacemaker insertion     No family history on file. History  Substance Use Topics  . Smoking status: Not on file  . Smokeless tobacco: Not on file  . Alcohol Use: No   OB History   Grav Para Term Preterm Abortions TAB SAB Ect Mult Living                 Review of Systems  Unable to perform ROS Gastrointestinal: Positive for abdominal pain.      Allergies  Review of patient's allergies indicates no known allergies.  Home Medications   Prior to Admission medications   Medication Sig Start Date End Date Taking? Authorizing Provider  acetaminophen (TYLENOL) 650 MG CR tablet Take 1,300 mg by mouth as needed for pain.    Yes Historical Provider, MD  aspirin 81 MG chewable tablet Chew 81 mg by mouth every morning.   Yes Historical Provider, MD   celecoxib (CELEBREX) 200 MG capsule Take 200 mg by mouth daily.   Yes Historical Provider, MD  levofloxacin (LEVAQUIN) 500 MG tablet Take 500 mg by mouth daily. For three days. Tuesday, Wednesday, Thursday   Yes Historical Provider, MD  Multiple Vitamin (MULTIVITAMIN WITH MINERALS) TABS tablet Take 1 tablet by mouth every morning.   Yes Historical Provider, MD  NIFEdipine (PROCARDIA XL/ADALAT-CC) 60 MG 24 hr tablet Take 60 mg by mouth every morning.   Yes Historical Provider, MD  omeprazole (PRILOSEC) 40 MG capsule Take 40 mg by mouth every morning.   Yes Historical Provider, MD  pravastatin (PRAVACHOL) 80 MG tablet Take 80 mg by mouth at bedtime.   Yes Historical Provider, MD  PRESCRIPTION MEDICATION Apply 1 application topically as directed. Itraconazole and ibuprofen topical compounded cream.  Apply to right big toe as directed.   Yes Historical Provider, MD  ranitidine (ZANTAC) 150 MG capsule Take 150 mg by mouth every evening.   Yes Historical Provider, MD  VITAMIN E PO Take 1 capsule by mouth every morning.   Yes Historical Provider, MD   BP 117/66  Pulse 80  Temp(Src) 97.8 F (36.6 C) (Oral)  SpO2 97% Physical Exam  Nursing note and vitals reviewed. Constitutional: She appears well-developed.  Elderly, frail  HENT:  Head: Normocephalic and atraumatic.  Right Ear: External ear normal.  Left Ear: External ear  normal.  Eyes: Conjunctivae and EOM are normal. Pupils are equal, round, and reactive to light.  Neck: Normal range of motion and phonation normal. Neck supple.  Cardiovascular: Normal rate, regular rhythm and normal heart sounds.   Pulmonary/Chest: Effort normal and breath sounds normal. She exhibits no bony tenderness.  Abdominal: Soft. Bowel sounds are normal. She exhibits no distension and no mass. There is tenderness (Bilateral mild, lower quadrant abdominal tenderness.). There is no rebound and no guarding.  Musculoskeletal: Normal range of motion. She exhibits no edema  and no tenderness.  Neurological: She is alert. No cranial nerve deficit or sensory deficit. She exhibits normal muscle tone. Coordination normal.  Skin: Skin is warm, dry and intact.  Psychiatric: She has a normal mood and affect. Her behavior is normal. Judgment normal.    ED Course  Procedures (including critical care time) Medications  0.9 %  sodium chloride infusion ( Intravenous New Bag/Given 08/23/14 1434)  iohexol (OMNIPAQUE) 300 MG/ML solution 50 mL (not administered)  morphine 2 MG/ML injection 2 mg (2 mg Intravenous Given 08/23/14 1527)  ondansetron (ZOFRAN) injection 4 mg (4 mg Intravenous Given 08/23/14 1527)    Patient Vitals for the past 24 hrs:  BP Temp Temp src Pulse SpO2  08/23/14 1227 117/66 mmHg 97.8 F (36.6 C) Oral 80 97 %    4:12 PM Reevaluation with update and discussion. After initial assessment and treatment, an updated evaluation reveals she is somewhat more comfortable after treatment. Daughter was updated on the findings, currently. Allison Alvarado    Labs Review Labs Reviewed  CBC WITH DIFFERENTIAL - Abnormal; Notable for the following:    Platelets 149 (*)    All other components within normal limits  COMPREHENSIVE METABOLIC PANEL - Abnormal; Notable for the following:    BUN 29 (*)    Creatinine, Ser 1.81 (*)    GFR calc non Af Amer 22 (*)    GFR calc Af Amer 26 (*)    All other components within normal limits  URINE CULTURE  LIPASE, BLOOD  URINALYSIS, ROUTINE W REFLEX MICROSCOPIC    Imaging Review No results found.   EKG Interpretation None      MDM   Final diagnoses:  Right lower quadrant abdominal pain    Nonspecific abdominal pain, despite treatment with Levaquin. CT scan ordered to evaluate for acute intra-abdominal processes.  Nursing Notes Reviewed/ Care Coordinated, and agree without changes. Applicable Imaging Reviewed.  Interpretation of Laboratory Data incorporated into ED treatment  Plan: Care to Dr.Kohut  to  evaluate after return of CT image  Flint MelterElliott Alvarado Meshelle Holness, MD 08/23/14 1614

## 2014-08-23 NOTE — ED Notes (Signed)
Pt's daughter states that she has had RLQ pain since last Saturday.  Has been to urgent care and was told she had UTI.  Given abx but still having pain.

## 2014-08-24 LAB — URINE CULTURE
Colony Count: NO GROWTH
Culture: NO GROWTH
SPECIAL REQUESTS: NORMAL

## 2014-10-17 IMAGING — CR DG FOREARM 2V*L*
2 series · 2 of 2 positions shown · non-contrast
Comparison: None.

CLINICAL DATA: Pain post fall

LEFT FOREARM - 2 VIEW

[x forearm ap left]
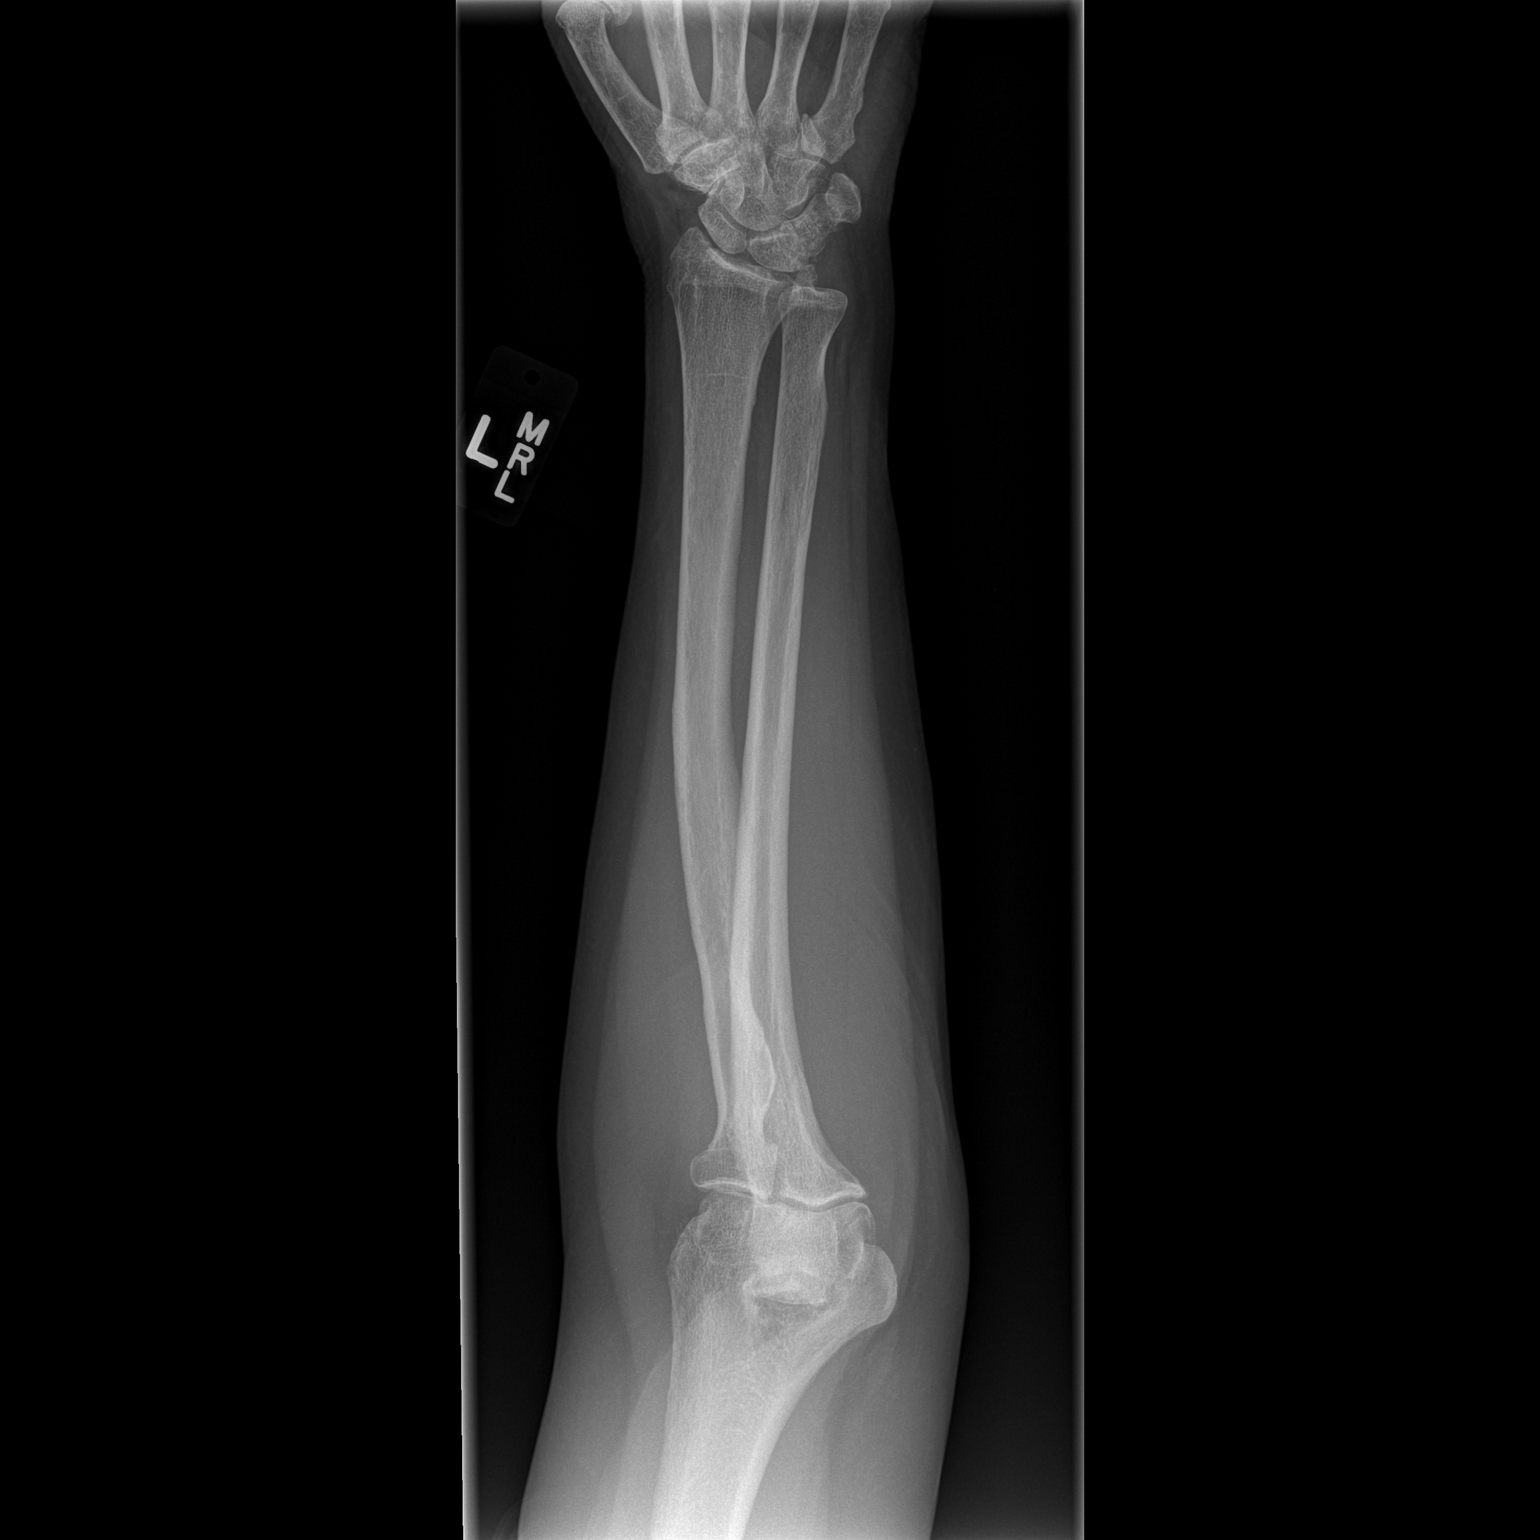

[x forearm lat left]
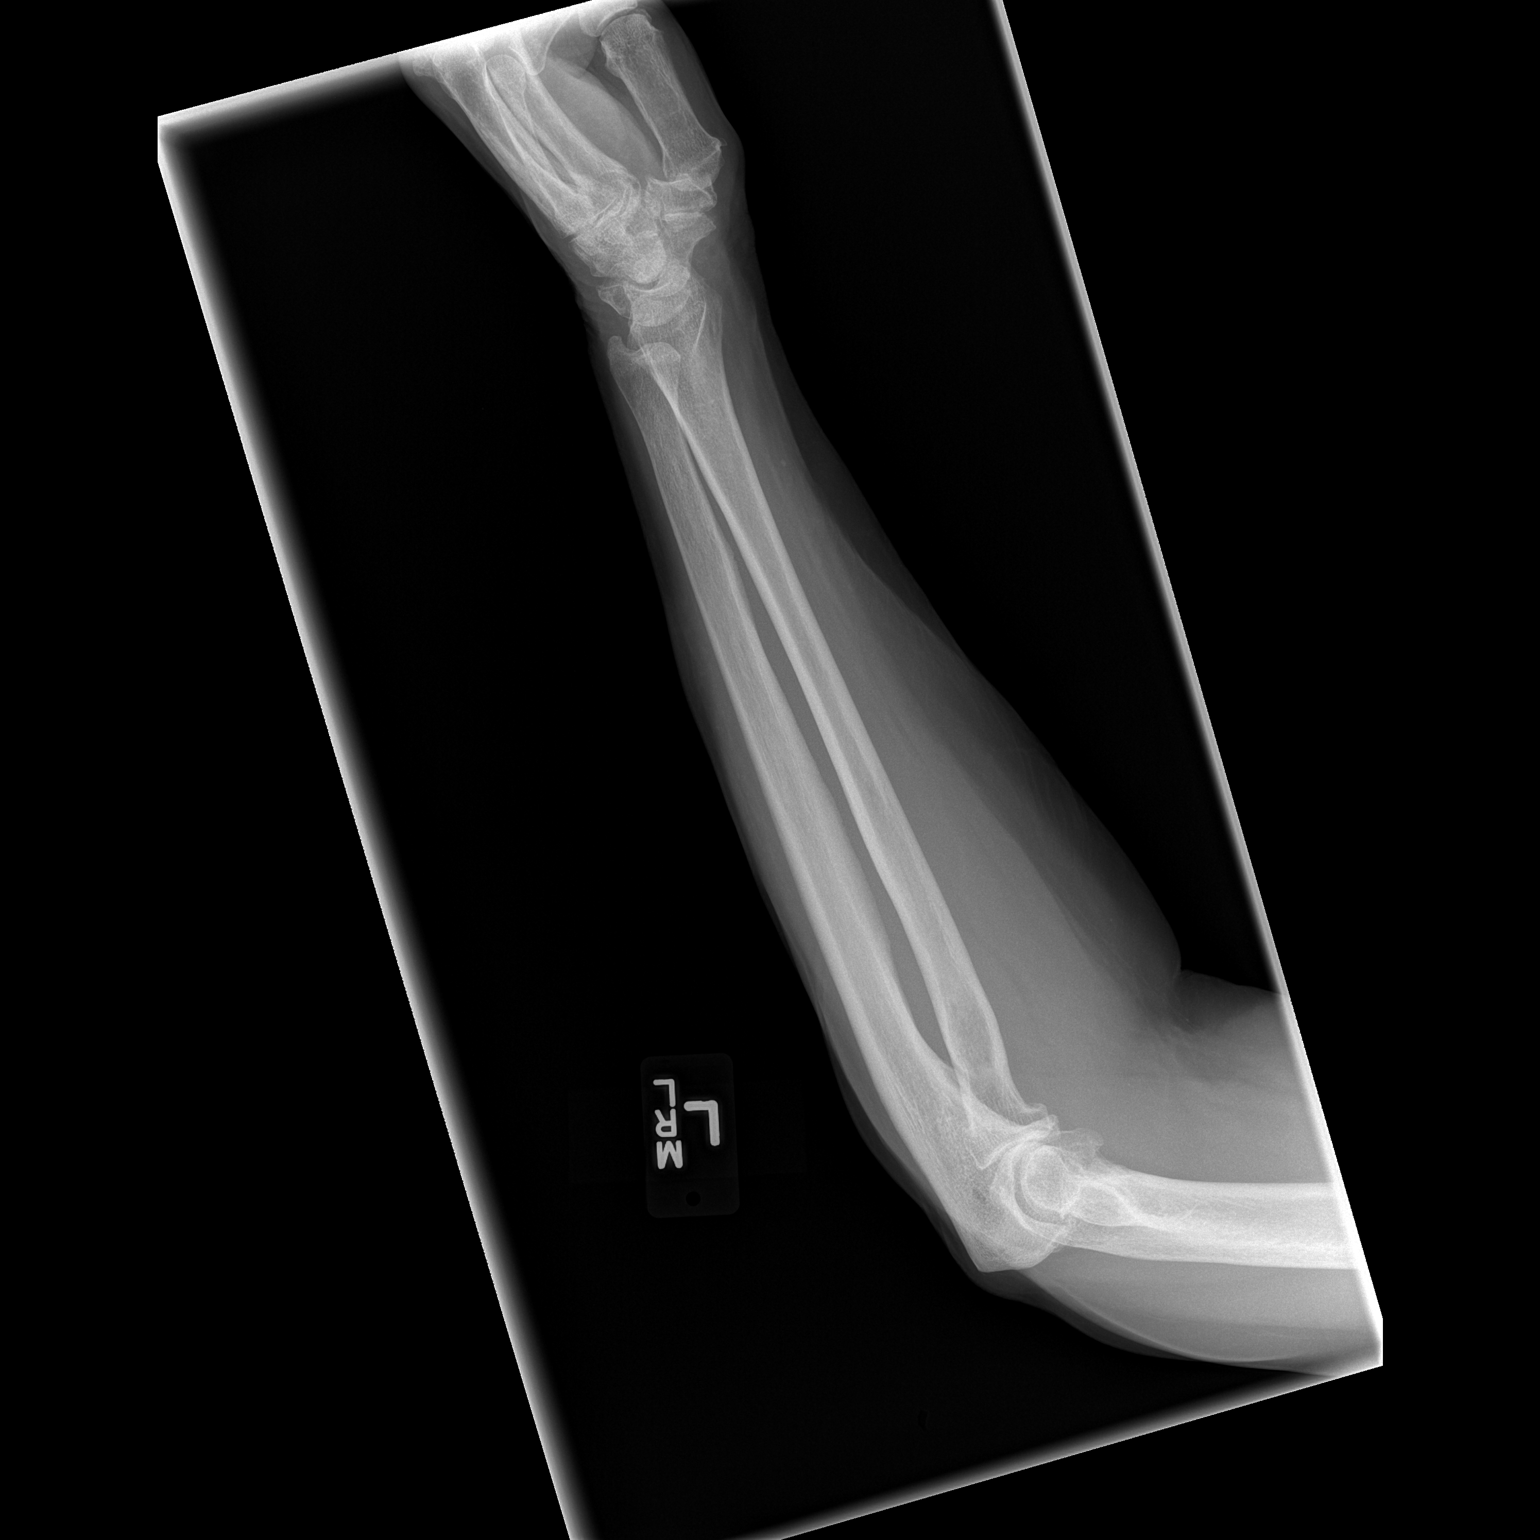

[2 of 2 positions shown; findings below may reference images not displayed]

FINDINGS: Two views of the left forearm submitted.  No acute
fracture or subluxation.  No radiopaque foreign body.
IMPRESSION: No acute fracture or subluxation.

## 2014-10-17 IMAGING — CR DG SHOULDER 2+V*L*
4 series · 4 of 4 positions shown · non-contrast
Comparison: None.

CLINICAL DATA: Pain post fall

LEFT SHOULDER - 2+ VIEW

[t shoulder ap external left]
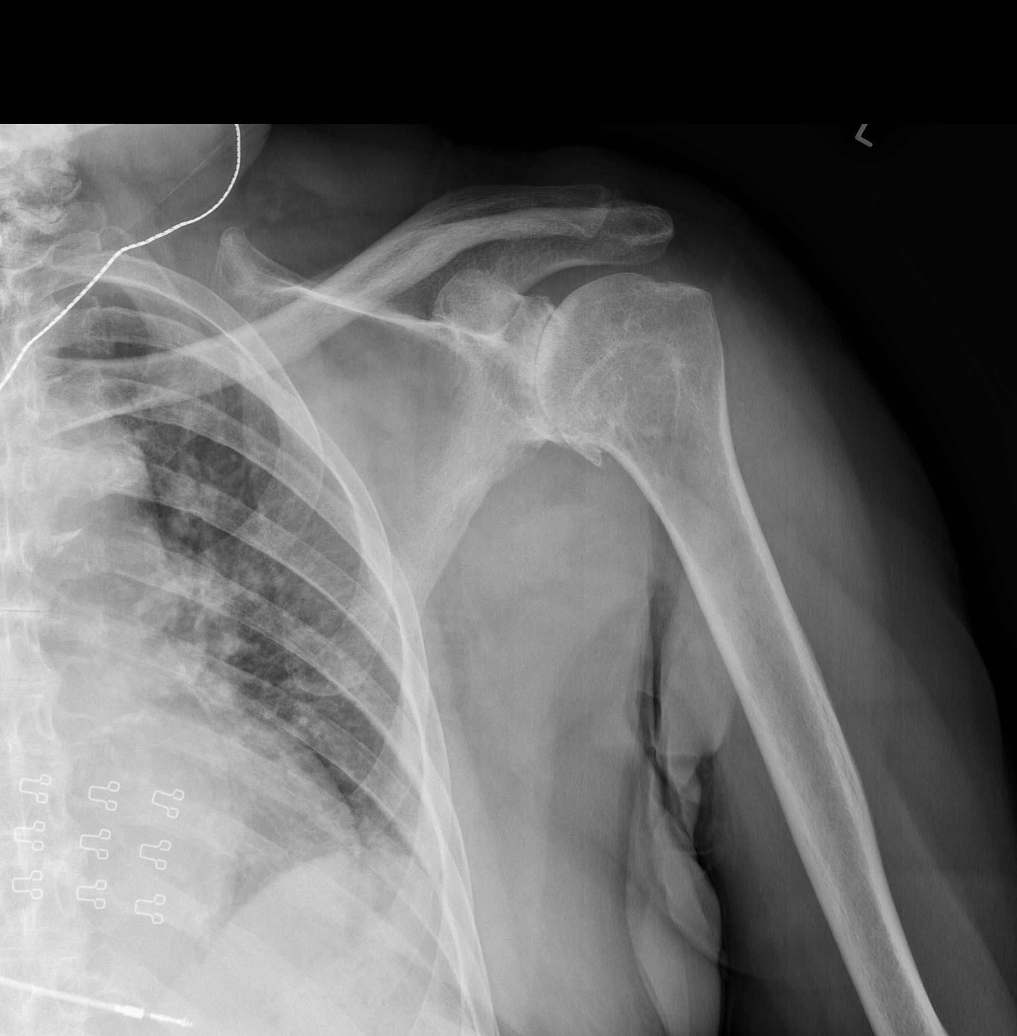

[t shoulder y view left]
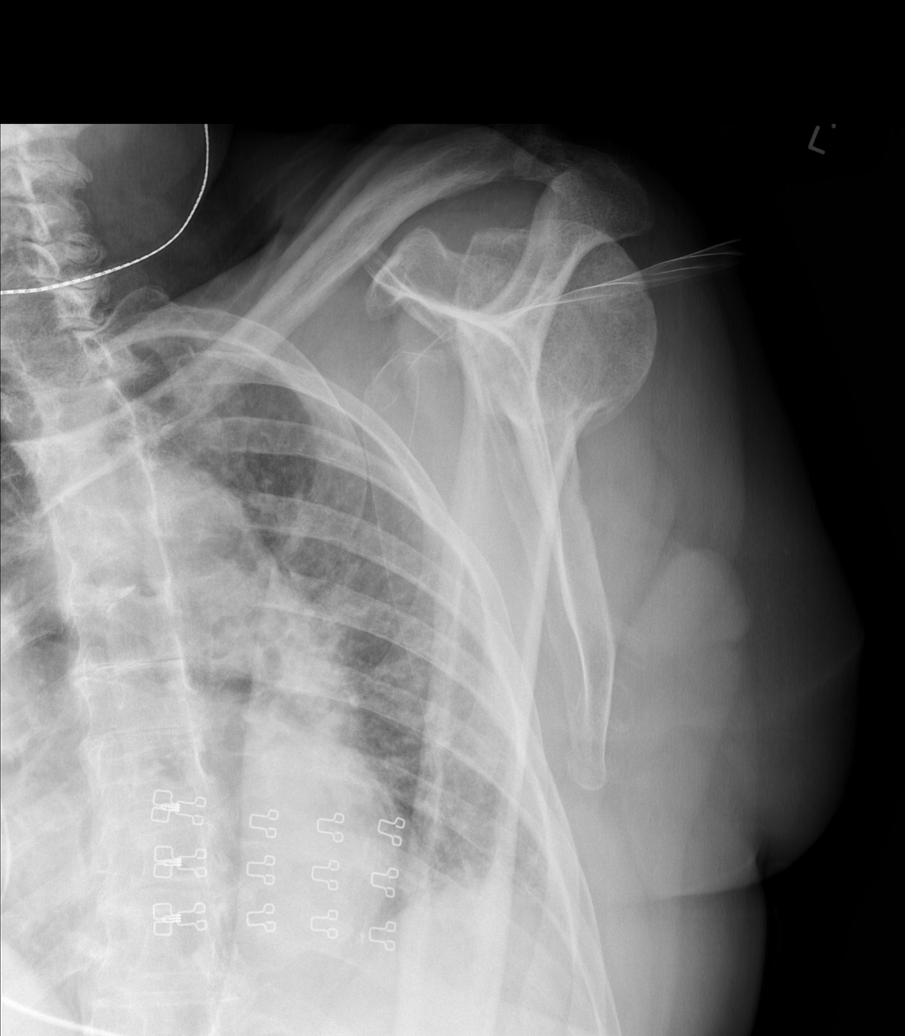

[x shoulder axillary left]
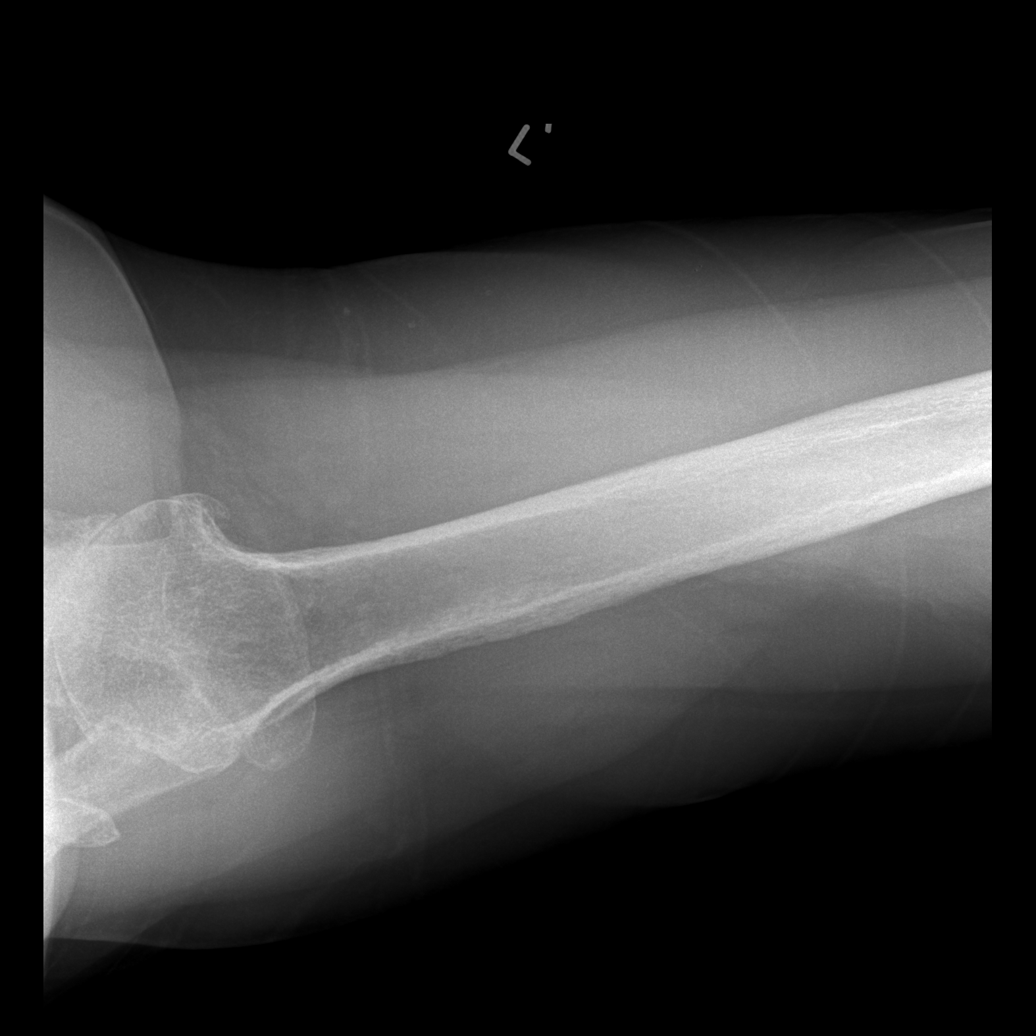

[view not recorded]
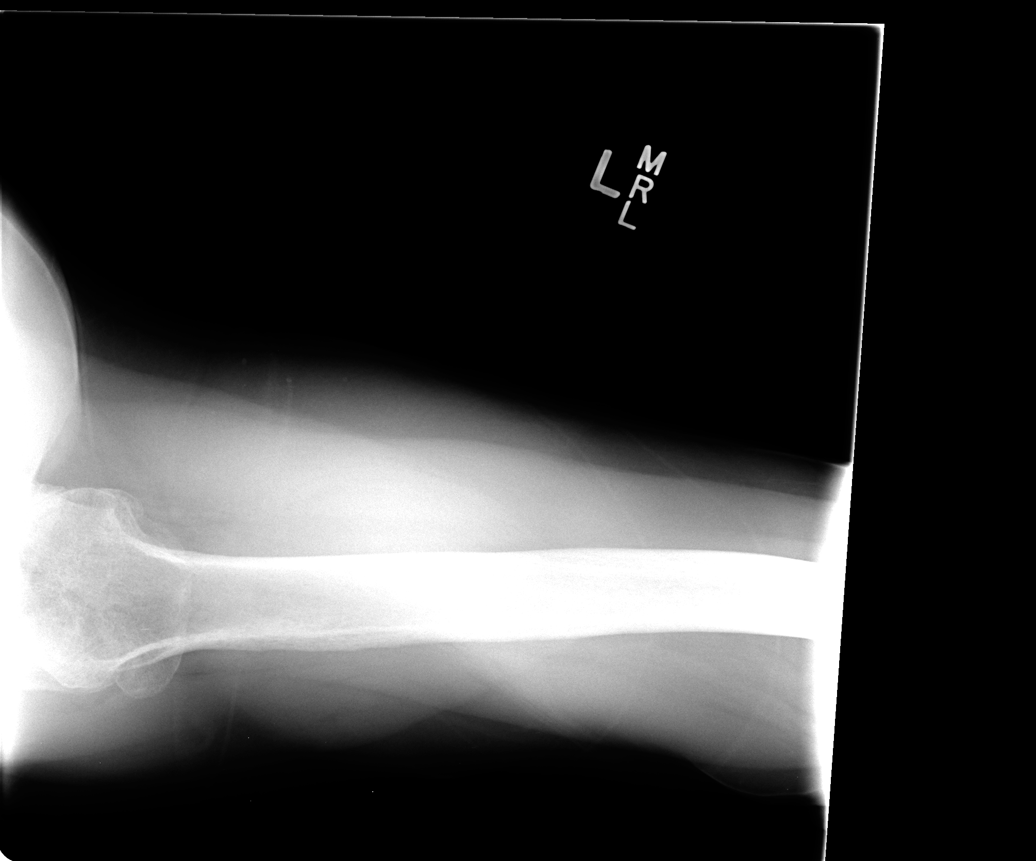

[4 of 4 positions shown; findings below may reference images not displayed]

FINDINGS: Five views of the left shoulder submitted.  No acute
fracture or subluxation.  Extensive degenerative narrowing of the
glenohumeral joint space.  Spurring of the humeral head.
IMPRESSION: No acute fracture or subluxation.  Extensive degenerative narrowing
of glenohumeral joint space.

## 2014-10-17 IMAGING — CR DG HAND COMPLETE 3+V*L*
3 series · 3 of 3 positions shown · non-contrast
Comparison: None.

CLINICAL DATA: Pain post fall

LEFT HAND - COMPLETE 3+ VIEW

[x hand ap left]
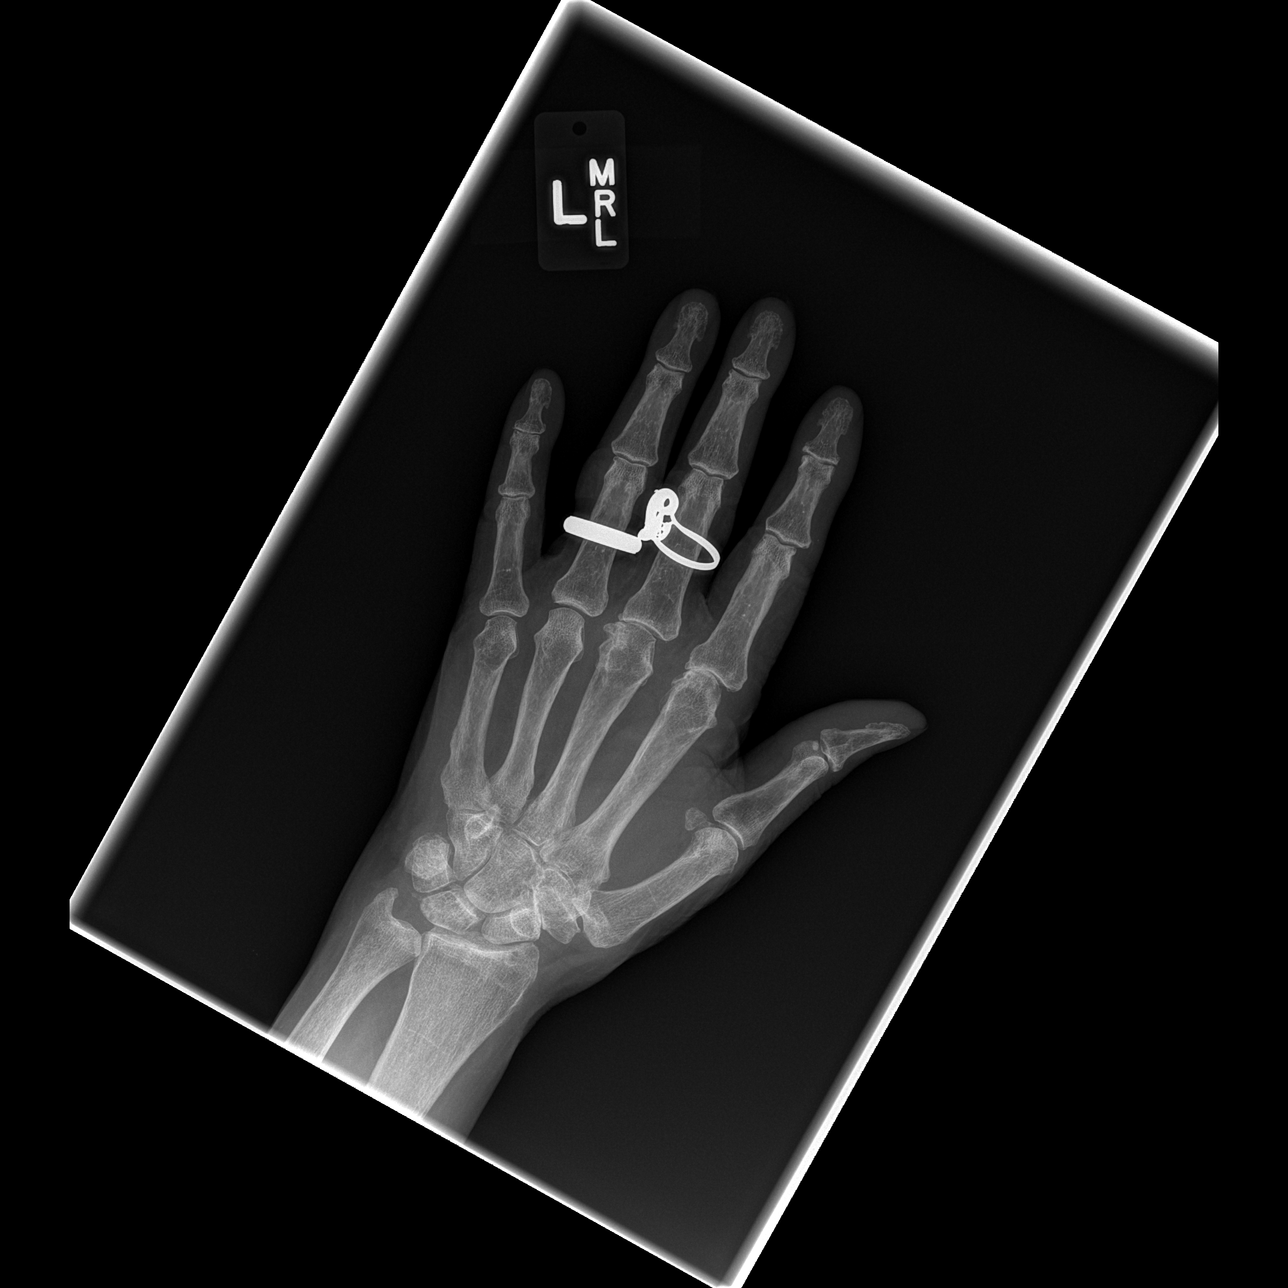

[x hand oblique left]
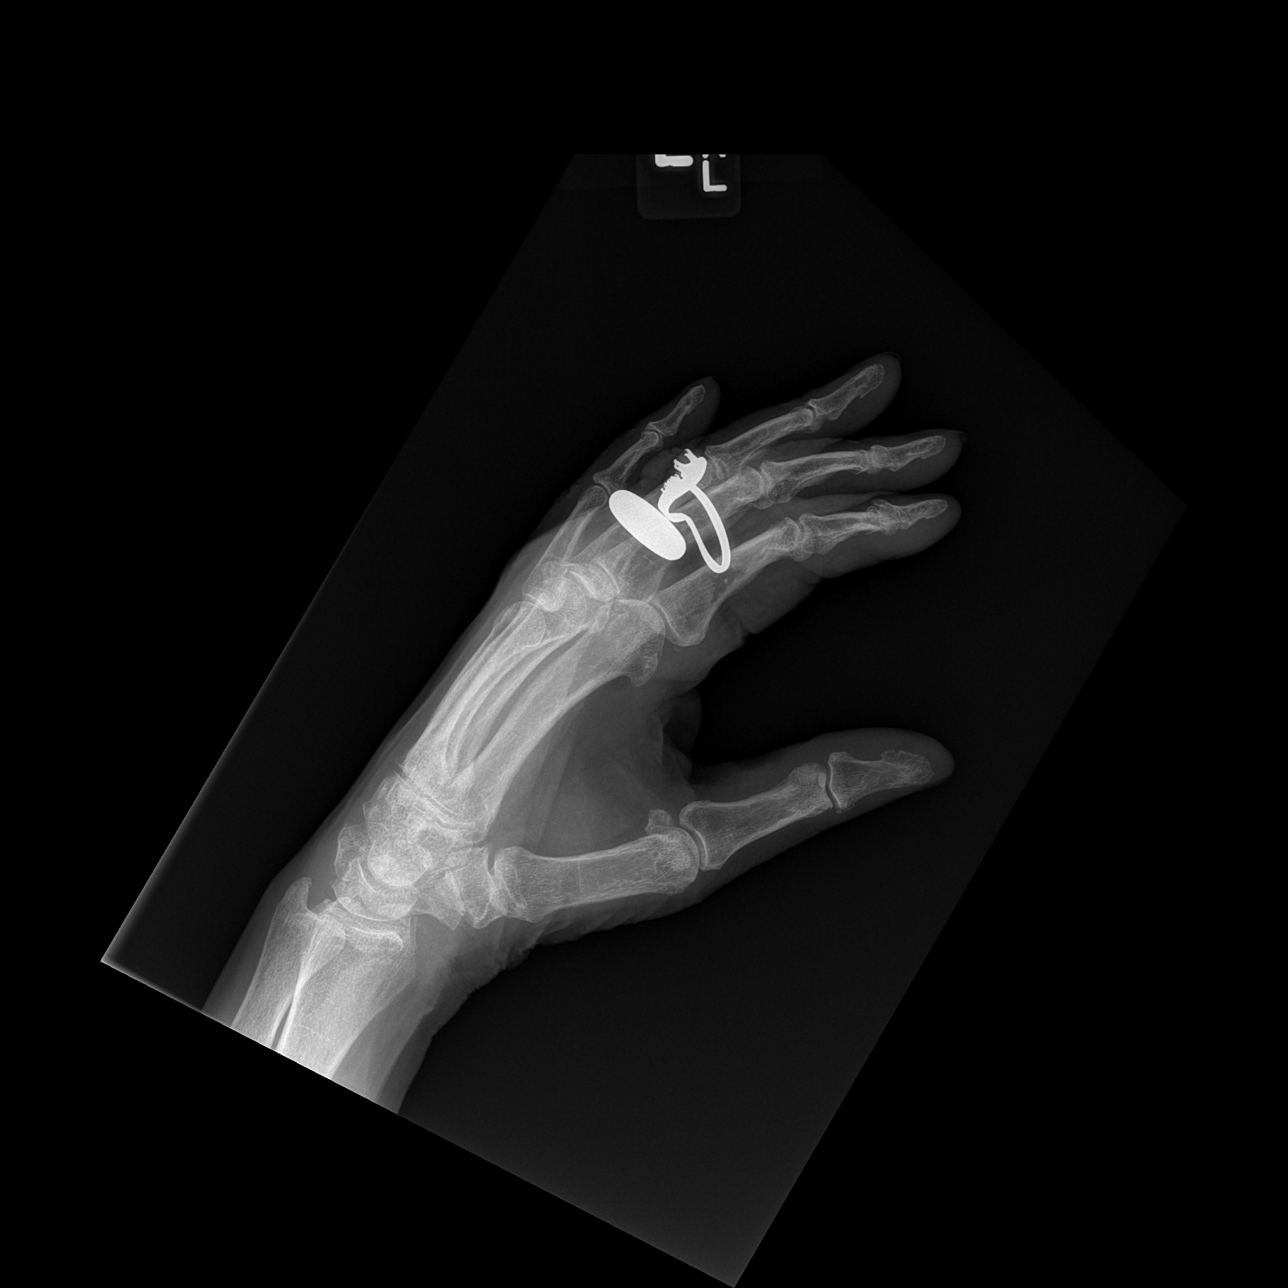

[x hand lat left]
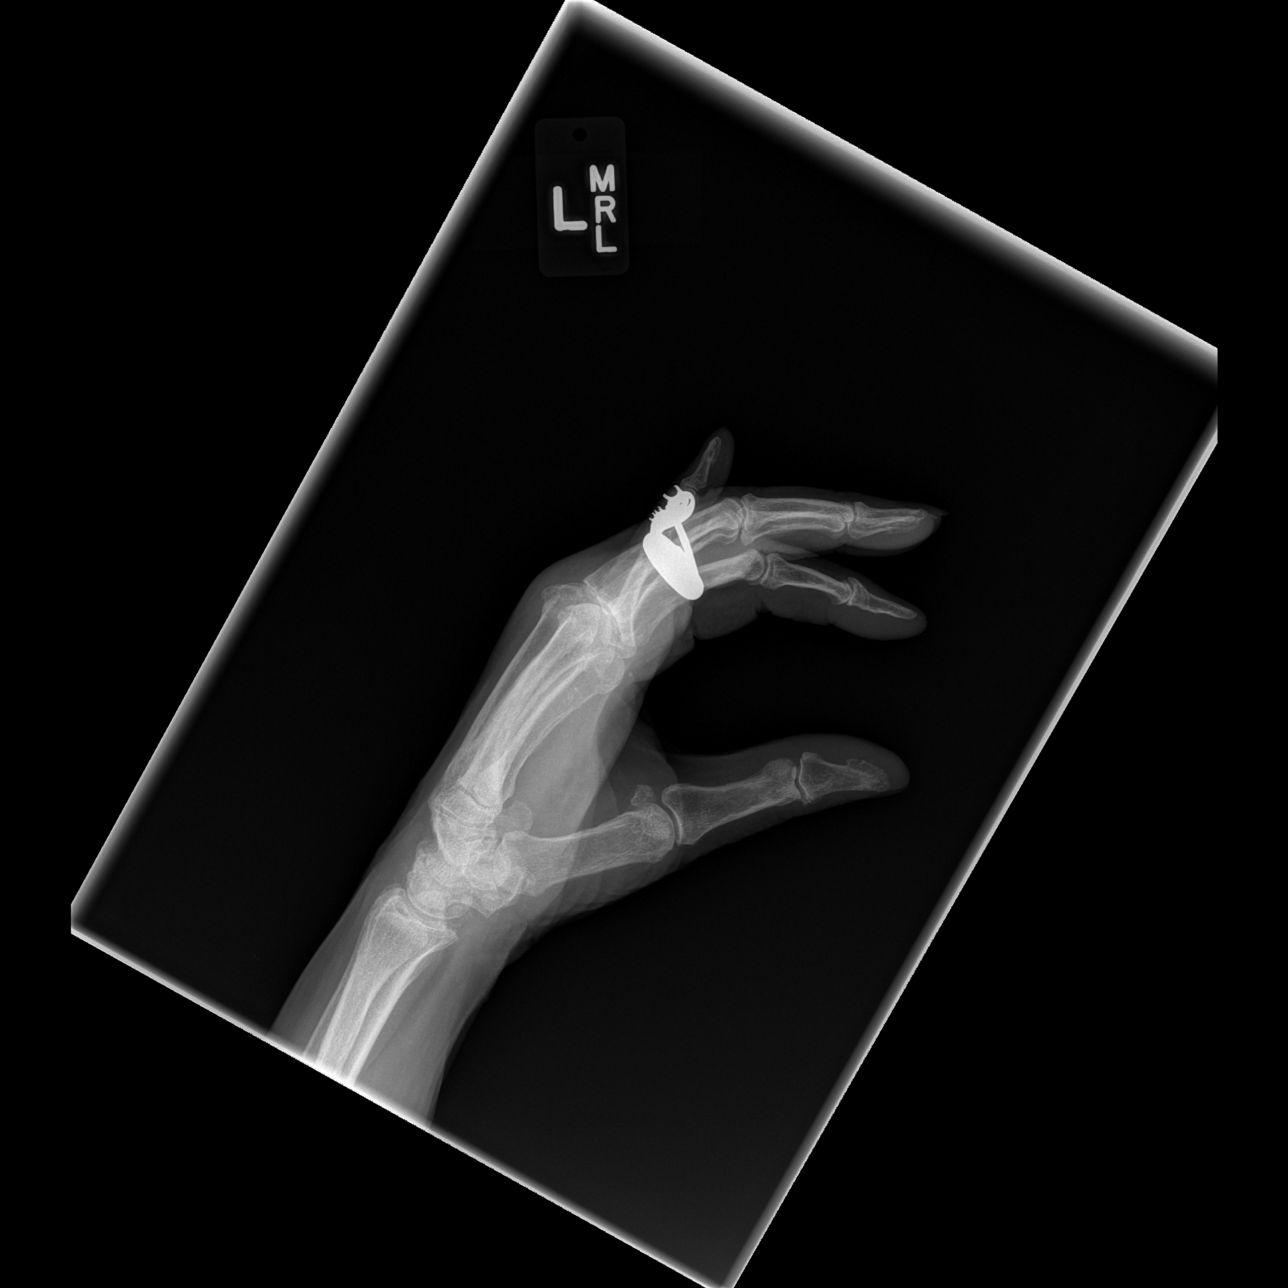

[3 of 3 positions shown; findings below may reference images not displayed]

FINDINGS: Three views of the left hand submitted.  No acute
fracture or subluxation.  There is diffuse osteopenia.
IMPRESSION: No acute fracture or subluxation.  Diffuse osteopenia.

## 2015-08-14 ENCOUNTER — Ambulatory Visit (INDEPENDENT_AMBULATORY_CARE_PROVIDER_SITE_OTHER): Payer: Medicare PPO | Admitting: Cardiology

## 2015-08-14 ENCOUNTER — Encounter: Payer: Self-pay | Admitting: Cardiology

## 2015-08-14 VITALS — BP 126/84 | HR 83 | Ht 65.0 in | Wt 169.4 lb

## 2015-08-14 DIAGNOSIS — I441 Atrioventricular block, second degree: Secondary | ICD-10-CM | POA: Diagnosis not present

## 2015-08-14 DIAGNOSIS — I4430 Unspecified atrioventricular block: Secondary | ICD-10-CM | POA: Diagnosis not present

## 2015-08-14 DIAGNOSIS — I10 Essential (primary) hypertension: Secondary | ICD-10-CM

## 2015-08-14 DIAGNOSIS — N189 Chronic kidney disease, unspecified: Secondary | ICD-10-CM | POA: Insufficient documentation

## 2015-08-14 DIAGNOSIS — E785 Hyperlipidemia, unspecified: Secondary | ICD-10-CM

## 2015-08-14 LAB — CUP PACEART INCLINIC DEVICE CHECK
Lead Channel Impedance Value: 415 Ohm
Lead Channel Pacing Threshold Amplitude: 0.8 V
Lead Channel Pacing Threshold Amplitude: 1.1 V
Lead Channel Pacing Threshold Pulse Width: 0.4 ms
Lead Channel Sensing Intrinsic Amplitude: 2.1 mV
Lead Channel Sensing Intrinsic Amplitude: 8.7 mV
Lead Channel Setting Pacing Amplitude: 2.5 V
Lead Channel Setting Pacing Pulse Width: 0.4 ms
MDC IDC MSMT LEADCHNL RV IMPEDANCE VALUE: 558 Ohm
MDC IDC MSMT LEADCHNL RV PACING THRESHOLD PULSEWIDTH: 0.4 ms
MDC IDC SESS DTM: 20160929040000
MDC IDC SET LEADCHNL RA PACING AMPLITUDE: 2 V
MDC IDC SET LEADCHNL RV SENSING SENSITIVITY: 2.5 mV
Pulse Gen Serial Number: 147611
Zone Setting Detection Interval: 375 ms

## 2015-08-14 NOTE — Progress Notes (Signed)
Electrophysiology Office Note   Date:  08/14/2015   ID:  Allison Alvarado, DOB [birth year], MRN [id number]  PCP:  PROVIDER NOT IN SYSTEM  Cardiologist:  Tanya Nones, MD Primary Electrophysiologist: Will Jorja Loa, MD    No chief complaint on file.    History of Present Illness: Allison Alvarado is a 81 y.o. female who presents today for electrophysiology evaluation.   She has a history of HTN, HLD, CKD, moderate TR, Mobitz II block s/p [employer] dual chamber pacemaker insert 12/13.  She lives in [place] for half the year and in [place] for the other half with her daughters.     Today, she denies symptoms of palpitations, orthopnea, PND, lower extremity edema, claudication, dizziness, presyncope, syncope, bleeding, or neurologic sequela. The patient is tolerating medications without difficulties and is otherwise without complaint today.   She does have shortness of breath with exertion.  She can get short of breath when walking across the room which is new for the last year.   Past Medical History  Diagnosis Date  . Arthritis   . Hypertension   . Hyperlipidemia    Past Surgical History  Procedure Laterality Date  . Pacemaker insertion       Current Outpatient Prescriptions  Medication Sig Dispense Refill  . acetaminophen (TYLENOL) 650 MG CR tablet Take 1,300 mg by mouth as needed for pain.     Marland Kitchen aspirin 81 MG chewable tablet Chew 81 mg by mouth every morning.    . celecoxib (CELEBREX) 200 MG capsule Take 200 mg by mouth daily.    Marland Kitchen levofloxacin (LEVAQUIN) 500 MG tablet Take 500 mg by mouth daily. For three days. Tuesday, Wednesday, Thursday    . Multiple Vitamin (MULTIVITAMIN WITH MINERALS) TABS tablet Take 1 tablet by mouth every morning.    Marland Kitchen NIFEdipine (PROCARDIA XL/ADALAT-CC) 60 MG 24 hr tablet Take 60 mg by mouth every morning.    Marland Kitchen omeprazole (PRILOSEC) 40 MG capsule Take 40 mg by mouth every morning.    . pravastatin (PRAVACHOL) 80 MG tablet Take 80 mg by  mouth at bedtime.    Marland Kitchen PRESCRIPTION MEDICATION Apply 1 application topically as directed. Itraconazole and ibuprofen topical compounded cream.  Apply to right big toe as directed.    . ranitidine (ZANTAC) 150 MG capsule Take 150 mg by mouth every evening.    . traMADol (ULTRAM) 50 MG tablet Take 1 tablet (50 mg total) by mouth every 6 (six) hours as needed. 15 tablet 0  . VITAMIN E PO Take 1 capsule by mouth every morning.     No current facility-administered medications for this visit.    Allergies:   Review of patient's allergies indicates no known allergies.   Social History:  The patient  reports that she has never smoked. She does not have any smokeless tobacco history on file. She reports that she does not drink alcohol or use illicit drugs.   Family History:  The patient's family history includes Cancer in her brother; Heart disease in her mother; Hypertension in her sister.    ROS:  Please see the history of present illness.   Otherwise, review of systems is positive for fatigue, chest pain and pressure, shortness of breath with activity, wheezing, back pain and dizziness.   All other systems are reviewed and negative.    PHYSICAL EXAM: VS:  BP 126/84 mmHg  Pulse 83  Ht  (1.651 m)  Wt 169 lb 6.4 oz (76.839 kg)  BMI 28.19 kg/m2 ,  BMI Body mass index is 28.19 kg/(m^2). GEN: Well nourished, well developed, in no acute distress HEENT: normal Neck: no JVD, carotid bruits, or masses Cardiac: RRR; no murmurs, rubs, or gallops,no edema  Respiratory:  clear to auscultation bilaterally, normal work of breathing GI: soft, nontender, nondistended, + BS MS: no deformity or atrophy Skin: warm and dry, device pocket is well healed Neuro:  Strength and sensation are intact Psych: euthymic mood, full affect  EKG:  EKG is ordered today. The ekg ordered today shows sinus rhythm, 1 degree AVB, rate 83, inferior and precordial TWI (old)  Device interrogation is reviewed today in  detail.  See PaceArt for details. Shows atrial fibrillation, flutter, and atrial tachycardia with a short run of VT (asymptomatic with all) Recent Labs: 08/23/2014: ALT 13; BUN 29*; Creatinine, Ser 1.81*; Hemoglobin 12.1; Platelets 149*; Potassium 4.7; Sodium 139    Lipid Panel     Component Value Date/Time   CHOL 123 05/18/2011 0256   TRIG 72 05/18/2011 0256   HDL 45 05/18/2011 0256   CHOLHDL 2.7 05/18/2011 0256   VLDL 14 05/18/2011 0256   LDLCALC 64 05/18/2011 0256     Wt Readings from Last 3 Encounters:  08/14/15 169 lb 6.4 oz (76.839 kg)      Other studies Reviewed: Additional studies/ records that were reviewed today include: TTE 03/2014  Review of the above records today demonstrates:  EF 55%, mild LVH, grade 1 diastolic dysfunction   ASSESSMENT AND PLAN:  1.  Mobitz 2 AV block: In sinus rhythm today with the pacemaker working appropriately.    2.  Atrial fibrillation: Having short episodes but some between one minute and one hour.  Will call her primary cardiologist to discuss anticoagulation.  She has a CHADS2VA2SC score of 6.  This patients CHA2DS2-VASc Score and unadjusted Ischemic Stroke Rate (% per year) is equal to 9.7 % stroke rate/year from a score of 6  Above score calculated as 1 point each if present [CHF, HTN, DM, Vascular=MI/PAD/Aortic Plaque, Age if 65-74, or Female] Above score calculated as 2 points each if present [Age > 75, or Stroke/TIA/TE]       Current medicines are reviewed at length with the patient today.   The patient does not have concerns regarding her medicines.  The following changes were made today:  none  Labs/ tests ordered today include: none  No orders of the defined types were placed in this encounter.     Disposition:   FU with PRN with Will Camnitz  Signed, Will Jorja Loa, MD  08/14/2015 11:41 AM     [facility] [address] [place] [address] [phone] (office) [phone]  (fax)

## 2015-08-14 NOTE — Patient Instructions (Addendum)
Medication Instructions:  Your physician recommends that you continue on your current medications as directed. Please refer to the Current Medication list given to you today.  Labwork: None ordered  Testing/Procedures: None ordered  Follow-Up: No follow up is needed at this time with Dr. Elberta Fortis.  He will see you on an as needed basis.   Any Other Special Instructions Will Be Listed Below (If Applicable). Thank you for choosing [facility]!!

## 2015-08-19 ENCOUNTER — Telehealth: Payer: Self-pay | Admitting: Cardiology

## 2015-08-19 NOTE — Telephone Encounter (Signed)
Informed patient's dtr that I left a message for physician in [place] and waiting for them to call me back. She understands I will contact her once I have reviewed anticoagulation  with their office.

## 2015-08-19 NOTE — Telephone Encounter (Signed)
New Message       Pt's daughter calling stating that Dr. Elberta Fortis was suppose to contact pt's cardiologist in [place] to discuss pt. States they have not heard anything back. Please call back and advise.

## 2015-08-20 NOTE — Telephone Encounter (Signed)
F/u       Office calling Sherri back.

## 2015-08-21 NOTE — Telephone Encounter (Signed)
Spoke with Dr. Kelle Darting nurse, Amy.  She is going to discuss with doctor and let us know advisement on this matter.

## 2015-08-21 NOTE — Telephone Encounter (Signed)
Follow up      Amy at Dr Kelle Darting office called you back.  Please call her when you return----[phone].  OK to wait until Sherri return to office.

## 2015-08-22 MED ORDER — ASPIRIN EC 325 MG PO TBEC: 325.0000 mg | DELAYED_RELEASE_TABLET | Freq: Every day | ORAL | Status: AC

## 2015-08-22 NOTE — Telephone Encounter (Signed)
Dr. Festus Aloe does not recommend Coumadin/NOAC.  She has increased patient's ASA to  and patient/family is aware. Informed Dr. Elberta Fortis.

## 2015-09-05 ENCOUNTER — Encounter: Payer: Self-pay | Admitting: Cardiology

## 2015-09-12 ENCOUNTER — Encounter: Payer: Self-pay | Admitting: Cardiology

## 2016-09-01 ENCOUNTER — Ambulatory Visit (INDEPENDENT_AMBULATORY_CARE_PROVIDER_SITE_OTHER): Payer: Self-pay | Admitting: Orthopedic Surgery

## 2018-04-22 ENCOUNTER — Emergency Department: Payer: MEDICARE

## 2018-04-22 ENCOUNTER — Inpatient Hospital Stay
Admit: 2018-04-22 | Discharge: 2018-04-23 | Disposition: A | Discharge status: Home / Self Care | Payer: MEDICARE | Attending: Emergency Medicine

## 2018-04-22 ENCOUNTER — Emergency Department: Admit: 2018-04-22 | Payer: MEDICARE

## 2018-04-22 DIAGNOSIS — B379 Candidiasis, unspecified: Secondary | ICD-10-CM

## 2018-04-22 LAB — COMPREHENSIVE METABOLIC PANEL
ALT: 17 U/L (ref 10–40)
AST: 19 U/L (ref 15–37)
Albumin/Globulin Ratio: 1 — ABNORMAL LOW (ref 1.1–2.2)
Albumin: 3.7 g/dL (ref 3.4–5.0)
Alkaline Phosphatase: 106 U/L (ref 40–129)
Anion Gap: 11 (ref 3–16)
BUN: 39 mg/dL — ABNORMAL HIGH (ref 7–20)
CO2: 23 mmol/L (ref 21–32)
Calcium: 9.2 mg/dL (ref 8.3–10.6)
Chloride: 103 mmol/L (ref 99–110)
Creatinine: 1.5 mg/dL — ABNORMAL HIGH (ref 0.6–1.2)
GFR African American: 39 — AB (ref >60)
GFR Non-African American: 32 — AB (ref >60)
Globulin: 3.6 g/dL
Glucose: 96 mg/dL (ref 70–99)
Potassium: 4.5 mmol/L (ref 3.5–5.1)
Sodium: 137 mmol/L (ref 136–145)
Total Bilirubin: <0.2 mg/dL (ref 0.0–1.0)
Total Protein: 7.3 g/dL (ref 6.4–8.2)

## 2018-04-22 LAB — CBC WITH AUTO DIFFERENTIAL
Basophils %: 0.4 %
Basophils Absolute: 0 10*3/uL (ref 0.0–0.2)
Eosinophils %: 0.6 %
Eosinophils Absolute: 0.1 10*3/uL (ref 0.0–0.6)
Hematocrit: 40 % (ref 36.0–48.0)
Hemoglobin: 12.9 g/dL (ref 12.0–16.0)
Lymphocytes %: 8.8 %
Lymphocytes Absolute: 0.9 10*3/uL — ABNORMAL LOW (ref 1.0–5.1)
MCH: 31.1 pg (ref 26.0–34.0)
MCHC: 32.3 g/dL (ref 31.0–36.0)
MCV: 96.2 fL (ref 80.0–100.0)
MPV: 8.5 fL (ref 5.0–10.5)
Monocytes %: 7 %
Monocytes Absolute: 0.7 10*3/uL (ref 0.0–1.3)
Neutrophils %: 83.2 %
Neutrophils Absolute: 8.6 10*3/uL — ABNORMAL HIGH (ref 1.7–7.7)
Platelets: 163 10*3/uL (ref 135–450)
RBC: 4.16 M/uL (ref 4.00–5.20)
RDW: 17.1 % — ABNORMAL HIGH (ref 12.4–15.4)
WBC: 10.3 10*3/uL (ref 4.0–11.0)

## 2018-04-22 MED ORDER — IPRATROPIUM-ALBUTEROL 0.5-2.5 (3) MG/3ML IN SOLN
Freq: Once | RESPIRATORY_TRACT | Status: AC
Start: 2018-04-22 — End: 2018-04-22
  Administered 2018-04-22: 23:00:00 1 via RESPIRATORY_TRACT

## 2018-04-22 MED ORDER — BENZONATATE 100 MG PO CAPS
100 MG | Freq: Once | ORAL | Status: AC
Start: 2018-04-22 — End: 2018-04-22
  Administered 2018-04-22: 23:00:00 100 mg via ORAL

## 2018-04-22 MED FILL — BENZONATATE 100 MG PO CAPS: 100 mg | ORAL | Qty: 1

## 2018-04-22 NOTE — ED Notes (Signed)
Unable to obtain D-dimer blood draw x2 straight sticks; failed ultrasound stick x2 MD notified. Dr. Berkley Harvey to insert long cath.       Elna Breslow, RN  04/23/18 [phone]

## 2018-04-22 NOTE — ED Provider Notes (Signed)
I independently performed a history and physical on Susan Arroyo.   All diagnostic, treatment, and disposition decisions were made by myself in conjunction with the mid-level provider.     For further details of [facility] emergency department encounter, please see Aundra Millet Burriss PACs documentation.Susan Arroyo is a 84 year old female who presents to the ED with a dry, non-productive cough x1 week. She was seen at Urgent Care and started on antibiotic and steroids, and she has not improved. Her daughter reports she "coughed her head off" last night and slept poorly. She denies chest or leg pain, no SOB.     On exam she is awake and alert, no acute distress. HEENT is grossly normal. Chest is generally clear, there is no evidence of pulmonary consolidation. Heart tones normal S1S2 without murmur. Abdomen soft, non-tender. Extremities are with out cyanosis or peripheral edema. Oral thrush is present.    BP (!) 140/78   Pulse 87   Temp 98.5 F (36.9 C) (Oral)   Resp 20   Ht 5\' 5"  (1.651 m)   Wt 175 lb (79.4 kg)   SpO2 100%   BMI 29.12 kg/m     Xr Chest Standard (2 Vw)    Result Date: 04/22/2018  EXAMINATION: TWO XRAY VIEWS OF THE CHEST 04/22/2018 2:59 pm COMPARISON: None. HISTORY: ORDERING SYSTEM PROVIDED HISTORY: cough - please assist in standing TECHNOLOGIST PROVIDED HISTORY: Reason for exam:->cough - please assist in standing Ordering Physician Provided Reason for Exam: persistant cough for about 1 week Acuity: Acute Type of Exam: Subsequent/Follow-up FINDINGS: Cardiopericardial silhouette is enlarged.  Bipolar pacemaker noted.  Mild dependent atelectasis.  No free air.  No pneumothorax.  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  No acute bony abnormality.     No acute abnormality detected.      11:37 PM: I discussed the history, physical, and treatment plan with Dr. Benna Dunks. Susan Arroyo was signed out in stable condition. Please see Dr. Vance Peper note for  further details, including diagnosis and disposition.     Jacquenette Shone, MD  04/22/18 [phone]

## 2018-04-22 NOTE — ED Provider Notes (Signed)
Procedure note: Ultrasound Guided Peripheral IV    Ultrasound guided 18 gauge peripheral 1.88 inch angiocath IV placement performed by me. Indications: Nursing unable to place IV. Details: The left antecubital fossa and upper arm was evaluated with a multifrequency linear probe. Several patent brachial veins are noted. 1 attempt was made to cannulate a left vein under realtime US guidance with successful cannulation of the vein and catheter placement. There is return of non-pulsatile dark red blood. The patient tolerated the procedure well without complications. An ultrasound image is archived.       Posey BoyerMichael C Tahlor Berenguer, MD  04/22/18 2157

## 2018-04-22 NOTE — ED Provider Notes (Signed)
[facility] Emergency Department    CHIEF COMPLAINT  Cough (urgent care gave her medications on 04/16/18 doxy and prednisone and it isnt better)      HISTORY OF PRESENT ILLNESS  Susan Arroyo is a 84 y.o. female who presents to the ED complaining of cough. Patient reports nonproductive cough started approximately 3 weeks ago. Patient reports her daughter had similar symptoms. Patient reports she was seen in an urgent care on June 2 who prescribed her doxycycline and prednisone. Patient reports the cough still isn't better. Patient denies fever, chills, body aches, chest pain, shortness of breath, abdominal pain, nausea, vomiting, diarrhea, constipation, dysuria, hematuria, flank pain, calf pain, leg swelling, bloody sputum. Patient denies any aggravating or alleviating factors. Patient reports she "coughs all night long." Patient did recently drive from [place] to [place] to visit her other daughter.  No other complaints, modifying factors or associated symptoms.     Nursing notes reviewed.   Past Medical History:   Diagnosis Date   ??? Hyperlipidemia    ??? Hypertension      Past Surgical History:   Procedure Laterality Date   ??? PACEMAKER PLACEMENT       History reviewed. No pertinent family history.  Social History     Socioeconomic History   ??? Marital status: Widowed     Spouse name: Not on file   ??? Number of children: Not on file   ??? Years of education: Not on file   ??? Highest education level: Not on file   Occupational History   ??? Not on file   Social Needs   ??? Financial resource strain: Not on file   ??? Food insecurity:     Worry: Not on file     Inability: Not on file   ??? Transportation needs:     Medical: Not on file     Non-medical: Not on file   Tobacco Use   ??? Smoking status: Never Smoker   ??? Smokeless tobacco: Never Used   Substance and Sexual Activity   ??? Alcohol use: Not Currently     Frequency: Never   ??? Drug use: Never   ??? Sexual activity: Not on file   Lifestyle   ???  Physical activity:     Days per week: Not on file     Minutes per session: Not on file   ??? Stress: Not on file   Relationships   ??? Social connections:     Talks on phone: Not on file     Gets together: Not on file     Attends religious service: Not on file     Active member of club or organization: Not on file     Attends meetings of clubs or organizations: Not on file     Relationship status: Not on file   ??? Intimate partner violence:     Fear of current or ex partner: Not on file     Emotionally abused: Not on file     Physically abused: Not on file     Forced sexual activity: Not on file   Other Topics Concern   ??? Not on file   Social History Narrative   ??? Not on file     No current facility-administered medications for this encounter.      Current Outpatient Medications   Medication Sig Dispense Refill   ??? nystatin (MYCOSTATIN) 100000 UNIT/ML suspension Take 5 mLs by mouth 4 times daily for 10 days Retain  in mouth as long as possible 200 mL 0   ??? aspirin 81 MG tablet Take 81 mg by mouth daily     ??? omeprazole (PRILOSEC) 20 MG delayed release capsule Take 40 mg by mouth daily     ??? NIFEdipine (ADALAT CC) 30 MG extended release tablet Take 60 mg by mouth daily     ??? vitamin E 400 UNIT capsule Take 400 Units by mouth daily     ??? acetaminophen (TYLENOL) 325 MG tablet Take 650 mg by mouth every 6 hours as needed for Pain     ??? pravastatin (PRAVACHOL) 80 MG tablet Take 80 mg by mouth daily       No Known Allergies    REVIEW OF SYSTEMS  10 systems reviewed, pertinent positives per HPI otherwise noted to be negative    PHYSICAL EXAM  BP 130/78    Pulse 78    Temp 98.5 ??F (36.9 ??C) (Oral)    Resp 22    Ht 5\' 5"  (1.651 m)    Wt 175 lb (79.4 kg)    SpO2 95%    BMI 29.12 kg/m??   GENERAL APPEARANCE: Awake and alert. Cooperative. No acute distress. Vital signs are stable. Well appearing and non toxic.   HEAD: Normocephalic. Atraumatic.  EYES: PERRL. EOM's grossly intact.   ENT: Mucous membranes are dry thrush seen on tongue  and oropharynx. No erythema or exudate. Uvula is midline.   NECK: Supple. Normal ROM.   HEART: RRR. No murmurs. Distal pulses are equal and intact. Cap refill less than 2 seconds.   LUNGS: Respirations unlabored. CTAB. Good air exchange. Speaking comfortably in full sentences. Expiratory wheezing heard in bilateral lower lung fields. No rhonchi, rales or stridor.   ABDOMEN: Soft. Non-distended. Non-tender. No guarding or rebound.   No masses. No organomegaly. No rigidity. Bowel sounds are normal. Negative murphy's. Negative McBurney's point. Negative CVA tenderness.   EXTREMITIES: No peripheral edema. Moves all extremities equally. All extremities neurovascularly intact.   SKIN: Warm and dry. No acute rashes.   NEUROLOGICAL: Alert and oriented. CN's 2-12 intact. No gross facial drooping. Strength 5/5, sensation intact. No dysarthria. No dysmetria. No ataxia.   PSYCHIATRIC: Normal mood and affect.    RADIOLOGY  Xr Chest Standard (2 Vw)    Result Date: 04/22/2018  EXAMINATION: TWO XRAY VIEWS OF THE CHEST 04/22/2018 2:59 pm COMPARISON: None. HISTORY: ORDERING SYSTEM PROVIDED HISTORY: cough - please assist in standing TECHNOLOGIST PROVIDED HISTORY: Reason for exam:->cough - please assist in standing Ordering Physician Provided Reason for Exam: persistant cough for about 1 week Acuity: Acute Type of Exam: Subsequent/Follow-up FINDINGS: Cardiopericardial silhouette is enlarged.  Bipolar pacemaker noted.  Mild dependent atelectasis.  No free air.  No pneumothorax.  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  No acute bony abnormality.     No acute abnormality detected.         ED COURSE  I have evaluated this patient in collaboration with Dr.KolodziK and Dr.Yelley.    Patient presents to the emergency department for evaluation of cough. Chest x-ray reveals no acute abnormality detected. CBC is unremarkable no leukocytosis or bandemia. CMP reveals a GFR of 32 and creatinine of 1.5. D-dimer is elevated  at 2634. CT of chest is pending.     Patient given DuoNeb and Lawyer.          MDM    DISPOSITION    12:59 AM: I discussed the history, physical, and treatment plan  with Dr. Dorena BodoYelley. Claretha CooperJessie Lowrimore was signed out in stable condition. Please see Dr. Vance PeperYelley's note for further details, including diagnosis and disposition.     Antonietta BarcelonaMegan A Burriss, APRN - CNP  04/23/18 [phone]

## 2018-04-22 NOTE — ED Provider Notes (Signed)
Emergency Department Provider Note     Location: [facility]  ED  04/22/2018    I independently performed a history and physical on Susan Arroyo.   All diagnostic, treatment, and disposition decisions were made by myself in conjunction with the mid-level provider.     Briefly, this is a 84 y.o. female here for cough.      ED Triage Vitals   BP Temp Temp Source Pulse Resp SpO2 Height Weight   04/22/18 1829 04/22/18 1752 04/22/18 1752 04/22/18 1752 04/22/18 1752 04/22/18 1752 04/22/18 1752 04/22/18 1752   136/76 98.5 ??F (36.9 ??C) Oral 81 20 100 % 5\' 5"  (1.651 m) 175 lb (79.4 kg)      Exam:  no acute distress, A&Ox4, heart RRR, no M/G/R, lungs CTAB, resp. Nonlabored, no abd tenderness, no peritoneal signs/no rebound/no guarding    Patient seen and examined.    EKG  The Ekg interpreted by me in the absence of a cardiologist shows.  normal sinus rhythm with a rate of 74  QTc is  within an acceptable range  Intervals and Durations are unremarkable.      No specific ST-T wave changes appreciated.  No evidence of acute ischemia.   First-degree AV block, PR 226      Xr Chest Standard (2 Vw)    Result Date: 04/22/2018  EXAMINATION: TWO XRAY VIEWS OF THE CHEST 04/22/2018 2:59 pm COMPARISON: None. HISTORY: ORDERING SYSTEM PROVIDED HISTORY: cough - please assist in standing TECHNOLOGIST PROVIDED HISTORY: Reason for exam:->cough - please assist in standing Ordering Physician Provided Reason for Exam: persistant cough for about 1 week Acuity: Acute Type of Exam: Subsequent/Follow-up FINDINGS: Cardiopericardial silhouette is enlarged.  Bipolar pacemaker noted.  Mild dependent atelectasis.  No free air.  No pneumothorax.  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  No acute bony abnormality.     No acute abnormality detected.     Nm Lung Vent/perfusion (vq)    Result Date: 04/23/2018  EXAMINATION: NUCLEAR MEDICINE VENTILATION PERFUSION SCAN. 04/23/2018 TECHNIQUE: 12.3 millicuries Xenon-133 was  administered via mask prior to planar imaging of the lungs in anterior and posterior projections.  Then, 4.0 millicuries of Tc 8m MAA was administered intravenously prior to planar imaging of the lungs in multiple projections. COMPARISON: Chest radiograph 04/22/2018. HISTORY: ORDERING SYSTEM PROVIDED HISTORY: elev dimer, cough FINDINGS: PERFUSION: There is heterogeneously diminished perfusion in both lower lobes, right greater than left.  This is best demonstrated on the RPO views.  Perfusion is otherwise normal. VENTILATION: There appears to be a matched ventilatory defect in the medial right lower lobe as demonstrated on the posterior views.  Posterior oblique views cannot be obtained on xenon ventilation imaging.  There is delayed washout from the lung bases, right greater than left. CHEST RADIOGRAPH: No focal areas of consolidation or significant effusions on recent chest radiograph.     This is most likely a low probability scan pattern for pulmonary embolus.       Results for orders placed or performed during the hospital encounter of 04/22/18   CBC auto differential   Result Value Ref Range    WBC 10.3 4.0 - 11.0 K/uL    RBC 4.16 4.00 - 5.20 M/uL    Hemoglobin 12.9 12.0 - 16.0 g/dL    Hematocrit 96.0 45.4 - 48.0 %    MCV 96.2 80.0 - 100.0 fL    MCH 31.1 26.0 - 34.0 pg    MCHC 32.3 31.0 - 36.0 g/dL  RDW 17.1 (H) 12.4 - 15.4 %    Platelets 163 135 - 450 K/uL    MPV 8.5 5.0 - 10.5 fL    Neutrophils % 83.2 %    Lymphocytes % 8.8 %    Monocytes % 7.0 %    Eosinophils % 0.6 %    Basophils % 0.4 %    Neutrophils # 8.6 (H) 1.7 - 7.7 K/uL    Lymphocytes # 0.9 (L) 1.0 - 5.1 K/uL    Monocytes # 0.7 0.0 - 1.3 K/uL    Eosinophils # 0.1 0.0 - 0.6 K/uL    Basophils # 0.0 0.0 - 0.2 K/uL   Comprehensive metabolic panel   Result Value Ref Range    Sodium 137 136 - 145 mmol/L    Potassium 4.5 3.5 - 5.1 mmol/L    Chloride 103 99 - 110 mmol/L    CO2 23 21 - 32 mmol/L    Anion Gap 11 3 - 16    Glucose 96 70 - 99 mg/dL    BUN 39  (H) 7 - 20 mg/dL    CREATININE 1.5 (H) 0.6 - 1.2 mg/dL    GFR Non-African American 32 (A) >60    GFR African American 39 (A) >60    Calcium 9.2 8.3 - 10.6 mg/dL    Total Protein 7.3 6.4 - 8.2 g/dL    Alb 3.7 3.4 - 5.0 g/dL    Albumin/Globulin Ratio 1.0 (L) 1.1 - 2.2    Total Bilirubin <0.2 0.0 - 1.0 mg/dL    Alkaline Phosphatase 106 40 - 129 U/L    ALT 17 10 - 40 U/L    AST 19 15 - 37 U/L    Globulin 3.6 g/dL   D-dimer, quantitative   Result Value Ref Range    D-Dimer, Quant 2634 (H) 0 - 229 ng/mL DDU   EKG 12 Lead   Result Value Ref Range    Ventricular Rate 74 BPM    Atrial Rate 74 BPM    P-R Interval 226 ms    QRS Duration 72 ms    Q-T Interval 344 ms    QTc Calculation (Bazett) 381 ms    P Axis 35 degrees    R Axis -28 degrees    T Axis -43 degrees    Diagnosis       Sinus rhythm with 1st degree A-V block with Blocked Premature atrial complexesInferior infarct , age undeterminedT wave abnormality, consider anterolateral ischemiaAbnormal ECGNo previous ECGs available         For further details of [facility] emergency department encounter, please see Megan Burriss, NP's documentation.  84 year old female with persistent cough, likely post-bronchitic, no evidence of pneumonia today, because of her elevated d-dimer and mild renal dysfunction which is not unexpected in a 84 year old, VQ scan was ordered rather than a CT, no evidence of obvious PE, given nystatin rinse for thrush from recent antibiotics/steroid use, given meds for cough, I followed up on the patient and dispoed after NP left pending V/Q, discussed with patient and family results, told follow up with primary care, Strict return precautions given, will return if any worsening symptoms or new concerns, patient verbalized understanding of plan, felt comfortable going home.      Clinical Impression:  1. Thrush    2. Cough        Orders Placed This Encounter   Procedures   ??? XR CHEST STANDARD (2 VW)   ??? NM LUNG VENT/PERFUSION (VQ)   ???  CBC auto  differential   ??? Comprehensive metabolic panel   ??? D-dimer, quantitative   ??? Telemetry monitoring   ??? EKG 12 Lead     Orders Placed This Encounter   Medications   ??? ipratropium-albuterol (DUONEB) nebulizer solution 1 ampule   ??? benzonatate (TESSALON) capsule 100 mg   ??? 0.9 % sodium chloride bolus   ??? DISCONTD: iopamidol (ISOVUE-370) 76 % injection 75 mL   ??? nystatin (MYCOSTATIN) 100000 UNIT/ML suspension     Sig: Take 5 mLs by mouth 4 times daily for 10 days Retain in mouth as long as possible     Dispense:  200 mL     Refill:  0   ??? xenon xe 133 gas 12.3 millicurie   ??? technetium albumin aggregated (MAA) solution 4 millicurie   ??? benzonatate (TESSALON) 200 MG capsule     Sig: Take 1 capsule by mouth 3 times daily as needed for Cough     Dispense:  15 capsule     Refill:  0        Disposition:  Patient discharged home in stable condition.      This chart was generated in part by using Dragon Dictation system and may contain errors related to that system including errors in grammar, punctuation, and spelling, as well as words and phrases that may be inappropriate. If there are any questions or concerns please feel free to contact the dictating provider for clarification.           Smitty Pluck, DO  04/24/18 [phone]

## 2018-04-23 ENCOUNTER — Emergency Department: Payer: MEDICARE

## 2018-04-23 ENCOUNTER — Emergency Department: Admit: 2018-04-23 | Payer: MEDICARE

## 2018-04-23 LAB — EKG 12-LEAD
Atrial Rate: 74 {beats}/min
P Axis: 35 degrees
P-R Interval: 226 ms
Q-T Interval: 344 ms
QRS Duration: 72 ms
QTc Calculation (Bazett): 381 ms
R Axis: -28 degrees
T Axis: -43 degrees
Ventricular Rate: 74 {beats}/min

## 2018-04-23 LAB — D-DIMER, QUANTITATIVE: D-Dimer, Quant: 2634 ng/mL DDU — ABNORMAL HIGH (ref 0–229)

## 2018-04-23 MED ORDER — IOPAMIDOL 76 % IV SOLN
76 % | Freq: Once | INTRAVENOUS | Status: DC | PRN
Start: 2018-04-23 — End: 2018-04-23

## 2018-04-23 MED ORDER — BENZONATATE 200 MG PO CAPS
200 MG | ORAL_CAPSULE | Freq: Three times a day (TID) | ORAL | 0 refills | Status: AC | PRN
Start: 2018-04-23 — End: 2018-04-30

## 2018-04-23 MED ORDER — NYSTATIN 100000 UNIT/ML MT SUSP
100000 UNIT/ML | Freq: Four times a day (QID) | OROMUCOSAL | 0 refills | Status: AC
Start: 2018-04-23 — End: 2018-05-03

## 2018-04-23 MED ORDER — TECHNETIUM TO 99M ALBUMIN AGGREGATED
Freq: Once | Status: AC | PRN
Start: 2018-04-23 — End: 2018-04-23
  Administered 2018-04-23: 06:00:00 4 via INTRAVENOUS

## 2018-04-23 MED ORDER — SODIUM CHLORIDE 0.9 % IV BOLUS
0.9 % | Freq: Once | INTRAVENOUS | Status: AC
Start: 2018-04-23 — End: 2018-04-23
  Administered 2018-04-23: 04:00:00 500 mL via INTRAVENOUS

## 2018-04-23 MED ORDER — XENON XE 133 10 MCI IN GAS
10 MCI | Freq: Once | RESPIRATORY_TRACT | Status: AC | PRN
Start: 2018-04-23 — End: 2018-04-23
  Administered 2018-04-23: 06:00:00 12.3 via RESPIRATORY_TRACT

## 2018-04-23 MED FILL — SODIUM CHLORIDE 0.9 % IV SOLN: 0.9 % | INTRAVENOUS | Qty: 500

## 2018-04-23 NOTE — ED Notes (Signed)
DC instructions provided to pt and daughter; pt and daughter verbalized understanding; son left with house keys and pts wheel chair, unable to get a hold of him for a ride at this time.      Elna Breslow, RN  04/23/18 [phone]

## 2018-04-23 NOTE — Discharge Instructions (Addendum)
Return to the ER if she develops fever, shortness of breath, chest pain, or any other concerns.

## 2018-08-21 ENCOUNTER — Encounter: Attending: Orthopaedic Surgery

## 2018-08-28 ENCOUNTER — Encounter: Attending: Orthopaedic Surgery

## 2019-05-16 DEATH — deceased
